# Patient Record
Sex: Female | Born: 1945 | Race: Black or African American | Hispanic: No | Smoking: Never smoker
Health system: Southern US, Community
[De-identification: ages and names within clinical notes are randomized; demographics above are authoritative.]

## PROBLEM LIST (undated history)

## (undated) DIAGNOSIS — J45909 Unspecified asthma, uncomplicated: Secondary | ICD-10-CM

## (undated) DIAGNOSIS — T4145XA Adverse effect of unspecified anesthetic, initial encounter: Secondary | ICD-10-CM

## (undated) DIAGNOSIS — S42253A Displaced fracture of greater tuberosity of unspecified humerus, initial encounter for closed fracture: Secondary | ICD-10-CM

## (undated) DIAGNOSIS — L732 Hidradenitis suppurativa: Secondary | ICD-10-CM

## (undated) DIAGNOSIS — M5430 Sciatica, unspecified side: Secondary | ICD-10-CM

## (undated) DIAGNOSIS — D649 Anemia, unspecified: Secondary | ICD-10-CM

## (undated) DIAGNOSIS — S62319A Displaced fracture of base of unspecified metacarpal bone, initial encounter for closed fracture: Secondary | ICD-10-CM

## (undated) DIAGNOSIS — L02419 Cutaneous abscess of limb, unspecified: Secondary | ICD-10-CM

## (undated) DIAGNOSIS — S4290XA Fracture of unspecified shoulder girdle, part unspecified, initial encounter for closed fracture: Secondary | ICD-10-CM

## (undated) DIAGNOSIS — L03119 Cellulitis of unspecified part of limb: Secondary | ICD-10-CM

## (undated) DIAGNOSIS — G5600 Carpal tunnel syndrome, unspecified upper limb: Secondary | ICD-10-CM

## (undated) DIAGNOSIS — I219 Acute myocardial infarction, unspecified: Secondary | ICD-10-CM

## (undated) HISTORY — PX: TUBAL LIGATION: SHX77

## (undated) HISTORY — DX: Fracture of unspecified shoulder girdle, part unspecified, initial encounter for closed fracture: S42.90XA

## (undated) HISTORY — DX: Hidradenitis suppurativa: L73.2

## (undated) HISTORY — DX: Cutaneous abscess of limb, unspecified: L02.419

## (undated) HISTORY — PX: OTHER SURGICAL HISTORY: SHX169

## (undated) HISTORY — PX: NASAL SINUS SURGERY: SHX719

## (undated) HISTORY — DX: Displaced fracture of base of unspecified metacarpal bone, initial encounter for closed fracture: S62.319A

## (undated) HISTORY — PX: APPENDECTOMY: SHX54

## (undated) HISTORY — DX: Sciatica, unspecified side: M54.30

## (undated) HISTORY — DX: Displaced fracture of greater tuberosity of unspecified humerus, initial encounter for closed fracture: S42.253A

## (undated) HISTORY — DX: Anemia, unspecified: D64.9

## (undated) HISTORY — PX: KNEE SURGERY: SHX244

## (undated) HISTORY — DX: Cellulitis of unspecified part of limb: L03.119

---

## 1983-06-20 DIAGNOSIS — T8859XA Other complications of anesthesia, initial encounter: Secondary | ICD-10-CM

## 1983-06-20 HISTORY — DX: Other complications of anesthesia, initial encounter: T88.59XA

## 1999-02-10 ENCOUNTER — Emergency Department (HOSPITAL_COMMUNITY): Admission: EM | Admit: 1999-02-10 | Discharge: 1999-02-10 | Payer: Self-pay | Admitting: Emergency Medicine

## 2001-03-16 ENCOUNTER — Emergency Department (HOSPITAL_COMMUNITY): Admission: EM | Admit: 2001-03-16 | Discharge: 2001-03-16 | Payer: Self-pay | Admitting: Emergency Medicine

## 2001-03-16 ENCOUNTER — Encounter: Payer: Self-pay | Admitting: Emergency Medicine

## 2001-08-15 ENCOUNTER — Ambulatory Visit (HOSPITAL_COMMUNITY): Admission: RE | Admit: 2001-08-15 | Discharge: 2001-08-15 | Payer: Self-pay | Admitting: Orthopaedic Surgery

## 2001-08-15 ENCOUNTER — Encounter: Payer: Self-pay | Admitting: Orthopaedic Surgery

## 2002-08-16 ENCOUNTER — Emergency Department (HOSPITAL_COMMUNITY): Admission: EM | Admit: 2002-08-16 | Discharge: 2002-08-16 | Payer: Self-pay | Admitting: *Deleted

## 2002-10-03 ENCOUNTER — Emergency Department (HOSPITAL_COMMUNITY): Admission: EM | Admit: 2002-10-03 | Discharge: 2002-10-03 | Payer: Self-pay | Admitting: Emergency Medicine

## 2002-10-07 ENCOUNTER — Ambulatory Visit (HOSPITAL_COMMUNITY): Admission: RE | Admit: 2002-10-07 | Discharge: 2002-10-07 | Payer: Self-pay | Admitting: Emergency Medicine

## 2002-10-07 ENCOUNTER — Encounter: Payer: Self-pay | Admitting: Emergency Medicine

## 2003-05-18 ENCOUNTER — Emergency Department (HOSPITAL_COMMUNITY): Admission: EM | Admit: 2003-05-18 | Discharge: 2003-05-18 | Payer: Self-pay | Admitting: Emergency Medicine

## 2003-11-27 ENCOUNTER — Ambulatory Visit (HOSPITAL_COMMUNITY): Admission: RE | Admit: 2003-11-27 | Discharge: 2003-11-27 | Payer: Self-pay | Admitting: Family Medicine

## 2004-07-28 ENCOUNTER — Ambulatory Visit: Payer: Self-pay | Admitting: Internal Medicine

## 2004-10-27 ENCOUNTER — Emergency Department (HOSPITAL_COMMUNITY): Admission: EM | Admit: 2004-10-27 | Discharge: 2004-10-27 | Payer: Self-pay | Admitting: Emergency Medicine

## 2005-02-15 ENCOUNTER — Ambulatory Visit (HOSPITAL_COMMUNITY): Admission: RE | Admit: 2005-02-15 | Discharge: 2005-02-15 | Payer: Self-pay | Admitting: Internal Medicine

## 2005-07-15 ENCOUNTER — Emergency Department (HOSPITAL_COMMUNITY): Admission: EM | Admit: 2005-07-15 | Discharge: 2005-07-15 | Payer: Self-pay | Admitting: Emergency Medicine

## 2005-12-29 ENCOUNTER — Emergency Department (HOSPITAL_COMMUNITY): Admission: EM | Admit: 2005-12-29 | Discharge: 2005-12-29 | Payer: Self-pay | Admitting: Emergency Medicine

## 2006-10-15 ENCOUNTER — Encounter (INDEPENDENT_AMBULATORY_CARE_PROVIDER_SITE_OTHER): Payer: Self-pay | Admitting: Specialist

## 2006-10-15 ENCOUNTER — Ambulatory Visit (HOSPITAL_COMMUNITY): Admission: RE | Admit: 2006-10-15 | Discharge: 2006-10-15 | Payer: Self-pay | Admitting: Internal Medicine

## 2006-10-15 ENCOUNTER — Ambulatory Visit: Payer: Self-pay | Admitting: Internal Medicine

## 2006-10-15 HISTORY — PX: COLONOSCOPY: SHX174

## 2006-12-31 ENCOUNTER — Emergency Department (HOSPITAL_COMMUNITY): Admission: EM | Admit: 2006-12-31 | Discharge: 2006-12-31 | Payer: Self-pay | Admitting: Emergency Medicine

## 2007-01-07 ENCOUNTER — Ambulatory Visit (HOSPITAL_COMMUNITY): Admission: RE | Admit: 2007-01-07 | Discharge: 2007-01-07 | Payer: Self-pay | Admitting: Emergency Medicine

## 2007-01-07 ENCOUNTER — Emergency Department (HOSPITAL_COMMUNITY): Admission: EM | Admit: 2007-01-07 | Discharge: 2007-01-07 | Payer: Self-pay | Admitting: Emergency Medicine

## 2007-02-26 ENCOUNTER — Emergency Department (HOSPITAL_COMMUNITY): Admission: EM | Admit: 2007-02-26 | Discharge: 2007-02-26 | Payer: Self-pay | Admitting: Emergency Medicine

## 2007-05-03 ENCOUNTER — Emergency Department (HOSPITAL_COMMUNITY): Admission: EM | Admit: 2007-05-03 | Discharge: 2007-05-03 | Payer: Self-pay | Admitting: Emergency Medicine

## 2007-05-17 ENCOUNTER — Ambulatory Visit (HOSPITAL_COMMUNITY): Admission: RE | Admit: 2007-05-17 | Discharge: 2007-05-17 | Payer: Self-pay | Admitting: Family Medicine

## 2007-10-10 ENCOUNTER — Encounter (INDEPENDENT_AMBULATORY_CARE_PROVIDER_SITE_OTHER): Payer: Self-pay | Admitting: Family Medicine

## 2007-10-25 ENCOUNTER — Telehealth (INDEPENDENT_AMBULATORY_CARE_PROVIDER_SITE_OTHER): Payer: Self-pay | Admitting: *Deleted

## 2007-10-25 ENCOUNTER — Ambulatory Visit: Payer: Self-pay | Admitting: Internal Medicine

## 2007-10-26 ENCOUNTER — Encounter (INDEPENDENT_AMBULATORY_CARE_PROVIDER_SITE_OTHER): Payer: Self-pay | Admitting: Internal Medicine

## 2007-10-28 ENCOUNTER — Telehealth (INDEPENDENT_AMBULATORY_CARE_PROVIDER_SITE_OTHER): Payer: Self-pay | Admitting: *Deleted

## 2007-10-28 LAB — CONVERTED CEMR LAB
BUN: 13 mg/dL (ref 6–23)
Basophils Absolute: 0 10*3/uL (ref 0.0–0.1)
Basophils Relative: 0 % (ref 0–1)
Hemoglobin: 12.3 g/dL (ref 12.0–15.0)
Lymphocytes Relative: 23 % (ref 12–46)
MCHC: 31.9 g/dL (ref 30.0–36.0)
MCV: 75.2 fL — ABNORMAL LOW (ref 78.0–100.0)
Monocytes Absolute: 0.5 10*3/uL (ref 0.1–1.0)
Monocytes Relative: 6 % (ref 3–12)
Neutro Abs: 5.8 10*3/uL (ref 1.7–7.7)
Neutrophils Relative %: 69 % (ref 43–77)
Potassium: 4.3 meq/L (ref 3.5–5.3)
RBC: 5.12 M/uL — ABNORMAL HIGH (ref 3.87–5.11)
RDW: 15.8 % — ABNORMAL HIGH (ref 11.5–15.5)
Sodium: 144 meq/L (ref 135–145)
TSH: 2.164 microintl units/mL (ref 0.350–5.50)
Triglycerides: 102 mg/dL (ref <150)

## 2007-11-12 ENCOUNTER — Telehealth (INDEPENDENT_AMBULATORY_CARE_PROVIDER_SITE_OTHER): Payer: Self-pay | Admitting: *Deleted

## 2007-12-04 ENCOUNTER — Ambulatory Visit: Payer: Self-pay | Admitting: Internal Medicine

## 2007-12-04 ENCOUNTER — Telehealth (INDEPENDENT_AMBULATORY_CARE_PROVIDER_SITE_OTHER): Payer: Self-pay | Admitting: Internal Medicine

## 2007-12-04 DIAGNOSIS — L732 Hidradenitis suppurativa: Secondary | ICD-10-CM

## 2007-12-04 HISTORY — DX: Hidradenitis suppurativa: L73.2

## 2007-12-18 HISTORY — PX: LIPOMA EXCISION: SHX5283

## 2007-12-25 ENCOUNTER — Ambulatory Visit (HOSPITAL_COMMUNITY): Admission: RE | Admit: 2007-12-25 | Discharge: 2007-12-25 | Payer: Self-pay | Admitting: General Surgery

## 2007-12-25 ENCOUNTER — Encounter (INDEPENDENT_AMBULATORY_CARE_PROVIDER_SITE_OTHER): Payer: Self-pay | Admitting: General Surgery

## 2008-01-10 ENCOUNTER — Emergency Department (HOSPITAL_COMMUNITY): Admission: EM | Admit: 2008-01-10 | Discharge: 2008-01-10 | Payer: Self-pay | Admitting: Emergency Medicine

## 2008-02-04 ENCOUNTER — Telehealth (INDEPENDENT_AMBULATORY_CARE_PROVIDER_SITE_OTHER): Payer: Self-pay | Admitting: *Deleted

## 2008-02-05 ENCOUNTER — Ambulatory Visit: Payer: Self-pay | Admitting: Internal Medicine

## 2008-02-12 ENCOUNTER — Encounter (INDEPENDENT_AMBULATORY_CARE_PROVIDER_SITE_OTHER): Payer: Self-pay | Admitting: Internal Medicine

## 2008-07-01 ENCOUNTER — Emergency Department (HOSPITAL_COMMUNITY): Admission: EM | Admit: 2008-07-01 | Discharge: 2008-07-01 | Payer: Self-pay | Admitting: Emergency Medicine

## 2008-11-27 ENCOUNTER — Ambulatory Visit: Payer: Self-pay | Admitting: Internal Medicine

## 2008-11-27 DIAGNOSIS — L02419 Cutaneous abscess of limb, unspecified: Secondary | ICD-10-CM

## 2008-11-27 DIAGNOSIS — L03119 Cellulitis of unspecified part of limb: Secondary | ICD-10-CM

## 2008-11-27 HISTORY — DX: Cellulitis of unspecified part of limb: L02.419

## 2008-11-27 HISTORY — DX: Cellulitis of unspecified part of limb: L03.119

## 2009-07-31 ENCOUNTER — Emergency Department (HOSPITAL_COMMUNITY): Admission: EM | Admit: 2009-07-31 | Discharge: 2009-08-01 | Payer: Self-pay | Admitting: Emergency Medicine

## 2009-08-17 DIAGNOSIS — I219 Acute myocardial infarction, unspecified: Secondary | ICD-10-CM

## 2009-08-17 HISTORY — DX: Acute myocardial infarction, unspecified: I21.9

## 2009-08-27 ENCOUNTER — Encounter: Payer: Self-pay | Admitting: Orthopedic Surgery

## 2009-08-27 ENCOUNTER — Emergency Department (HOSPITAL_COMMUNITY): Admission: EM | Admit: 2009-08-27 | Discharge: 2009-08-27 | Payer: Self-pay | Admitting: Emergency Medicine

## 2009-09-21 ENCOUNTER — Encounter: Payer: Self-pay | Admitting: Orthopedic Surgery

## 2009-09-21 ENCOUNTER — Ambulatory Visit (HOSPITAL_COMMUNITY)
Admission: RE | Admit: 2009-09-21 | Discharge: 2009-09-21 | Payer: Self-pay | Source: Home / Self Care | Admitting: Chiropractic Medicine

## 2009-09-29 ENCOUNTER — Ambulatory Visit (HOSPITAL_COMMUNITY): Admission: RE | Admit: 2009-09-29 | Discharge: 2009-09-29 | Payer: Self-pay | Admitting: Family Medicine

## 2009-11-16 ENCOUNTER — Ambulatory Visit: Payer: Self-pay | Admitting: Orthopedic Surgery

## 2009-11-16 DIAGNOSIS — S62319A Displaced fracture of base of unspecified metacarpal bone, initial encounter for closed fracture: Secondary | ICD-10-CM

## 2009-11-16 HISTORY — DX: Displaced fracture of base of unspecified metacarpal bone, initial encounter for closed fracture: S62.319A

## 2010-01-05 ENCOUNTER — Encounter: Payer: Self-pay | Admitting: Orthopedic Surgery

## 2010-01-07 ENCOUNTER — Telehealth: Payer: Self-pay | Admitting: Orthopedic Surgery

## 2010-01-10 ENCOUNTER — Ambulatory Visit: Payer: Self-pay | Admitting: Orthopedic Surgery

## 2010-01-20 ENCOUNTER — Emergency Department (HOSPITAL_COMMUNITY): Admission: EM | Admit: 2010-01-20 | Discharge: 2010-01-20 | Payer: Self-pay | Admitting: Emergency Medicine

## 2010-02-09 ENCOUNTER — Ambulatory Visit: Payer: Self-pay | Admitting: Orthopedic Surgery

## 2010-02-23 ENCOUNTER — Encounter (HOSPITAL_COMMUNITY)
Admission: RE | Admit: 2010-02-23 | Discharge: 2010-03-25 | Payer: Self-pay | Source: Home / Self Care | Admitting: Orthopedic Surgery

## 2010-03-08 ENCOUNTER — Encounter: Payer: Self-pay | Admitting: Orthopedic Surgery

## 2010-03-16 ENCOUNTER — Telehealth: Payer: Self-pay | Admitting: Orthopedic Surgery

## 2010-03-17 ENCOUNTER — Encounter: Payer: Self-pay | Admitting: Orthopedic Surgery

## 2010-03-31 ENCOUNTER — Encounter: Payer: Self-pay | Admitting: Orthopedic Surgery

## 2010-04-11 ENCOUNTER — Telehealth (INDEPENDENT_AMBULATORY_CARE_PROVIDER_SITE_OTHER): Payer: Self-pay | Admitting: *Deleted

## 2010-04-12 ENCOUNTER — Telehealth: Payer: Self-pay | Admitting: Orthopedic Surgery

## 2010-04-19 HISTORY — PX: SEPTOPLASTY: SUR1290

## 2010-05-10 ENCOUNTER — Ambulatory Visit: Payer: Self-pay | Admitting: Orthopedic Surgery

## 2010-05-24 ENCOUNTER — Telehealth: Payer: Self-pay | Admitting: Orthopedic Surgery

## 2010-05-27 ENCOUNTER — Ambulatory Visit (HOSPITAL_COMMUNITY)
Admission: RE | Admit: 2010-05-27 | Discharge: 2010-05-27 | Payer: Self-pay | Source: Home / Self Care | Attending: Family Medicine | Admitting: Family Medicine

## 2010-05-31 ENCOUNTER — Emergency Department (HOSPITAL_COMMUNITY)
Admission: EM | Admit: 2010-05-31 | Discharge: 2010-05-31 | Payer: Self-pay | Source: Home / Self Care | Admitting: Emergency Medicine

## 2010-05-31 ENCOUNTER — Encounter (INDEPENDENT_AMBULATORY_CARE_PROVIDER_SITE_OTHER): Payer: Self-pay | Admitting: *Deleted

## 2010-06-02 ENCOUNTER — Encounter (INDEPENDENT_AMBULATORY_CARE_PROVIDER_SITE_OTHER): Payer: Self-pay | Admitting: *Deleted

## 2010-06-02 ENCOUNTER — Inpatient Hospital Stay (HOSPITAL_COMMUNITY)
Admission: EM | Admit: 2010-06-02 | Discharge: 2010-06-09 | Payer: Self-pay | Source: Home / Self Care | Attending: Internal Medicine | Admitting: Internal Medicine

## 2010-06-03 ENCOUNTER — Inpatient Hospital Stay (HOSPITAL_COMMUNITY)
Admission: AD | Admit: 2010-06-03 | Discharge: 2010-06-09 | Payer: Self-pay | Attending: Internal Medicine | Admitting: Internal Medicine

## 2010-06-07 ENCOUNTER — Encounter (INDEPENDENT_AMBULATORY_CARE_PROVIDER_SITE_OTHER): Payer: Self-pay | Admitting: Internal Medicine

## 2010-06-17 ENCOUNTER — Emergency Department (HOSPITAL_COMMUNITY)
Admission: EM | Admit: 2010-06-17 | Discharge: 2010-06-17 | Payer: Self-pay | Source: Home / Self Care | Admitting: Emergency Medicine

## 2010-06-30 ENCOUNTER — Telehealth: Payer: Self-pay | Admitting: Orthopedic Surgery

## 2010-06-30 ENCOUNTER — Emergency Department (HOSPITAL_COMMUNITY)
Admission: EM | Admit: 2010-06-30 | Discharge: 2010-06-30 | Payer: Self-pay | Source: Home / Self Care | Admitting: Emergency Medicine

## 2010-07-04 LAB — COMPREHENSIVE METABOLIC PANEL
ALT: 18 U/L (ref 0–35)
AST: 17 U/L (ref 0–37)
Albumin: 3.2 g/dL — ABNORMAL LOW (ref 3.5–5.2)
Alkaline Phosphatase: 97 U/L (ref 39–117)
BUN: 6 mg/dL (ref 6–23)
CO2: 26 mEq/L (ref 19–32)
Calcium: 8.8 mg/dL (ref 8.4–10.5)
Chloride: 105 mEq/L (ref 96–112)
Creatinine, Ser: 0.83 mg/dL (ref 0.4–1.2)
GFR calc Af Amer: >60 mL/min (ref >60)
GFR calc non Af Amer: >60 mL/min (ref >60)
Glucose, Bld: 91 mg/dL (ref 70–99)
Potassium: 3.2 mEq/L — ABNORMAL LOW (ref 3.5–5.1)
Sodium: 141 mEq/L (ref 135–145)
Total Bilirubin: 0.6 mg/dL (ref 0.3–1.2)
Total Protein: 7.2 g/dL (ref 6.0–8.3)

## 2010-07-04 LAB — DIFFERENTIAL
Basophils Absolute: 0.1 10*3/uL (ref 0.0–0.1)
Basophils Relative: 1 % (ref 0–1)
Eosinophils Absolute: 1.3 10*3/uL — ABNORMAL HIGH (ref 0.0–0.7)
Eosinophils Relative: 11 % — ABNORMAL HIGH (ref 0–5)
Lymphocytes Relative: 11 % — ABNORMAL LOW (ref 12–46)
Lymphs Abs: 1.3 10*3/uL (ref 0.7–4.0)
Monocytes Absolute: 0.9 10*3/uL (ref 0.1–1.0)
Monocytes Relative: 8 % (ref 3–12)
Neutro Abs: 8 10*3/uL — ABNORMAL HIGH (ref 1.7–7.7)
Neutrophils Relative %: 69 % (ref 43–77)

## 2010-07-04 LAB — CBC
HCT: 33.6 % — ABNORMAL LOW (ref 36.0–46.0)
Hemoglobin: 11.2 g/dL — ABNORMAL LOW (ref 12.0–15.0)
MCH: 24.8 pg — ABNORMAL LOW (ref 26.0–34.0)
MCHC: 33.3 g/dL (ref 30.0–36.0)
MCV: 74.5 fL — ABNORMAL LOW (ref 78.0–100.0)
Platelets: 182 10*3/uL (ref 150–400)
RBC: 4.51 MIL/uL (ref 3.87–5.11)
RDW: 15.6 % — ABNORMAL HIGH (ref 11.5–15.5)
WBC: 11.6 10*3/uL — ABNORMAL HIGH (ref 4.0–10.5)

## 2010-07-09 ENCOUNTER — Encounter: Payer: Self-pay | Admitting: Family Medicine

## 2010-07-10 ENCOUNTER — Encounter: Payer: Self-pay | Admitting: Internal Medicine

## 2010-07-10 ENCOUNTER — Encounter: Payer: Self-pay | Admitting: Family Medicine

## 2010-07-11 ENCOUNTER — Encounter: Payer: Self-pay | Admitting: Internal Medicine

## 2010-07-14 NOTE — Op Note (Addendum)
Stacy Ashley, Stacy Ashley              ACCOUNT NO.:  [id number]  MEDICAL RECORD NO.:  [id number]          PATIENT TYPE:  INP  LOCATION:  5509                         FACILITY:  [facility]  PHYSICIAN:  Elvie Maines H. 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  POSTOPERATIVE DIAGNOSES:  Chronic pansinusitis with purulent secretions contained within both maxillary sinuses and the left sphenoid sinus. The left lateral sphenoid sinus contained a large polyp behind which was some inspissated purulent secretions.  There was no evidence of any other tumor or neoplasm.  PROCEDURE: 1. Endoscopic left sphenoidotomy with removal of mass. 2. Endoscopic left frontal sinusotomy. 3. Stealth image-guided system use.  SURGEON:  Davaun Quintela H. Pollyann Kennedy, MD.  ANESTHESIA:  General endotracheal anesthesia was used.  COMPLICATIONS:  No complications.  ESTIMATED BLOOD LOSS:  Less than 100 mL.  SPECIMEN: 1. Allergic fungal type mucinous material from the left maxillary     sinus sent for bacterial and fungal culture. 2. Left lateral sphenoid sinus mass. 3. Left sinus contents.  HISTORY:  This is a 65 year old lady who had sinus surgery at [facility] several years back.  She was admitted to the hospital with facial and pharyngeal swelling, severe nasal congestion, chronic sinusitis, and left trigeminal second and third division hypesthesia.  Risks, benefits, alternatives, complications of the procedure were explained to the patient and her daughter.  They seemed to understand and agreed to surgery.  PROCEDURE:  The patient was taken to the operating room, placed on the operative table in supine position.  Following induction of general endotracheal  anesthesia, the face was prepped and draped in standard fashion.  Ophthalmic lube was used and the eyes and the eyes were kept untaped.  The head gear was placed and registered with the image-guided system.  During the procedure, the straight and curved sections as well as a sinus seeker which was registered to the system were all used for navigation purposes.  This was particularly useful in identifying the mass in the lateral sphenoid to make sure that this did not involve contents of the orbit, optic nerve, or cavernous sinus as well as the carotid artery.  It was also helpful in identifying the left frontal sinus. 1. Left endoscopic sphenoidotomy with removal of tissue.  1% Xylocaine     with epinephrine was infiltrated on the superior and posterior     attachments into the middle turbinates bilaterally.  A spinal     needle was then used to inject this into the face of the sphenoid     inferior to the sphenoidotomy.  The 0-degree endoscope was used to     inspect the sphenoethmoidal recess.  The sphenoid sinus was opened     superiorly but occluded with polypoid tissue which was debrided     away using the microdebrider.  Once the sinus was entered, it was  seen that it was filled with purulent secretions which were     suctioned out.  The 30-degree scope was then used to inspect more     laterally where the polypoid mass was identified.  The navigation     system was used to confirm that this was in fact what appeared to     be the expansile mass on imaging.  It was difficult to find an     instrument to grab this at such an angle but I was able to get part     of it dislodged with a giraffe forceps and then I was able to pull     it out completely with Genevie Ann Blakesley forceps.  Lateral to this     mass, once the pus was suctioned out, the sinus looked clear and     intact without any bony dehiscence or any ulceration and no     evidence of any additional neoplasm. 2. Left  endoscopic frontal sinusotomy.  The middle turbinate was     completely intact including the ground lamella but it had a     lateralized position and with some adhesion to the lamina papyracea     making it very difficult to inspect the orbit.  The large Tru-Cut     forceps were then used to remove the anterior and inferior aspect     of middle turbinate thus facilitating exposure.  I was unable to     enter into the frontal ethmoidal recess with a curved suction and     into the frontal sinus.  Frontal sinus was very narrow but I was     able to get suction in and there was some thick mucoid secretions     and then I was able to suction out.  The bone around the frontal     duct was very thick and I did not attempt to enlarge the ostium. 3. The maxillary sinus on the left was inspected with a angled suction     and 30-degree scope and large amount of polypoid material was     cleaned out as well as the allergic fungal type mucinous material     which was sent for culture.  The left ethmoid cavity was packed     with Nasopore.  The right side was inspected and pus was suctioned     from maxillary sinus but the extensive polypoid disease was not     present on the right side, so no further action was taken.  The     pharynx was suctioned of blood and secretions.  The patient was     wakened, extubated, and transferred to recovery in stable     condition.     Chivonne Rascon H. Pollyann Kennedy, MD     JHR/MEDQ  D:  06/07/2010  T:  06/07/2010  Job:  [id number]  Electronically Signed by Serena Colonel MD on 07/14/2010 10:46:12 AM

## 2010-07-14 NOTE — Consult Note (Addendum)
Stacy Ashley, HUESMAN              ACCOUNT NO.:  [id number]  MEDICAL RECORD NO.:  [id number]          PATIENT TYPE:  INP  LOCATION:  5509                         FACILITY:  [facility]  PHYSICIAN:  Lorriane Dehart H. Pollyann Kennedy, MD     DATE OF BIRTH:  1945/09/30  DATE OF CONSULTATION:  06/03/2010 DATE OF DISCHARGE:                                CONSULTATION   REASON FOR CONSULTATION:  Breathing and swallowing difficulty, cervical adenopathy and sinusitis.  HISTORY:  This is a 65 year old lady previously in good health without any past medical history.  In August, she developed severe left-sided sinus congestion, pain and pressure and swelling of the entire left hemiface as well as numbness and tingling of the left side of the face. She was treated by her primary care physician with 5 weeks of an antibiotic and she describes that at the end of the 5 weeks thing started to go little bit better.  The symptoms then returned and she admitted to the hospital in [facility] for similar symptoms.  She was transferred here for continued ENT workup.  She has a history of sinus surgery some point in the past.  She takes no medications.  No known drug allergies.  PHYSICAL EXAMINATION:  She is a generally healthy-appearing lady in no distress.  She seems slightly short of breath, but is in no distress. She has no stridor and her voice is clear.  Oral cavity and pharynx are unremarkable.  There is multiple missing teeth, but no signs of infection.  Nasal exam reveals diffuse mucosal edema especially on the right side.  Palpation of the neck reveals multiple jugular and anterior triangle nodes, none larger than about 2 cm.  There was no palpable thyroid masses.  Flexible fiberoptic laryngoscopy was performed after topical Xylocaine viscous was applied.  The right nasal cavity was completely swollen, I was unable to pass the scope, but I was able to pass this relatively easily on the left.  There was inflammatory  changes of the nasopharyngeal mucosa without evidence of abscess, but there was some exudate on the surface.  The oropharynx, hypopharynx and larynx were completely normal to inspection.  The vocal cords were move well.  There was no masses and no evidence of airway obstruction.  CT is reviewed.  There is extensive postsurgical changes involving the ethmoid sinuses bilaterally and maxillary antrostomies bilaterally. There was diffuse mucosal soft tissue edema on the scan consistent with polyps.  There was opacity of the right frontal and thickening of the left frontal.  There was left sphenoid disease with a possible air-fluid level and mild thickening on the right.  The middle turbinate on the left side appear surgically absent.  There was no obvious orbital mass or infratemporal fossa mass seen and the CT of the neck and chest reveals multiple large lymph nodes bilaterally in the neck and mediastinum.  None larger than about 2 cm.  IMPRESSION:  Chronic sinusitis with evidence of pulmonary disease.  No evidence of airway obstruction.  No evidence of laryngeal or hypopharyngeal pathology.  The most worrisome two symptoms are the edema she had at the  left side of the face and the hypesthesia she had on the left side which she currently displaced as well on examination.  The second and third divisions of the trigeminies are diminished to soft touch on the left side when compared to the right side and when compared to the first division.  This is worrisome for neoplastic process.  I would recommend an MRI of the head neck to rule that out perhaps something in the intracranial fossa or in the infratemporal fossa.  She seems stable enough to not remain in the intensive care unit setting and I will discuss that with her primary care team.  We will follow up this weekend with results of the MRI and whenever other findings we should uncover.     Caterra Ostroff H. Pollyann Kennedy, MD     JHR/MEDQ  D:   06/03/2010  T:  06/04/2010  Job:  [id number]  Electronically Signed by Serena Colonel MD on 07/14/2010 10:46:09 AM

## 2010-07-15 ENCOUNTER — Encounter: Payer: Self-pay | Admitting: Orthopedic Surgery
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
## 2010-07-19 NOTE — Progress Notes (Signed)
Summary: asking for referral to Dr Ladona Ridgel, chiropractor  Phone Note Call from Patient   Caller: Patient Summary of Call: Patient states physical therapy not helping LT hand, states still having pain with bending & moving hand, and seems to be also in LT wrist all the time.  Asking if we can do a referral to Dr Reatha Harps, chiropractor.  States she has been to him before/ was advised referral needed from our office for problem LT hand pain.  Patient ph# [phone]. Initial call taken by: Cammie Sickle,  April 11, 2010 3:10 PM  Follow-up for Phone Call        okay Follow-up by: Fuller Canada MD,  April 11, 2010 3:40 PM  Additional Follow-up for Phone Call Additional follow up Details #1::        made referral Additional Follow-up by: Chasity Tereasa Coop,  April 11, 2010 4:51 PM

## 2010-07-19 NOTE — Miscellaneous (Signed)
Summary: PT progress notes  PT progress notes   Imported By: Jacklynn Ganong 05/11/2010 07:52:25  _____________________________________________________________________  External Attachment:    Type:   Image     Comment:   External Document

## 2010-07-19 NOTE — Miscellaneous (Signed)
Summary: OT clinical evaluation  OT clinical evaluation   Imported By: Jacklynn Ganong 03/08/2010 12:19:50  _____________________________________________________________________  External Attachment:    Type:   Image     Comment:   External Document

## 2010-07-19 NOTE — Miscellaneous (Signed)
Summary: PT evaluation  PT evaluation   Imported By: Jacklynn Ganong 03/31/2010 10:06:25  _____________________________________________________________________  External Attachment:    Type:   Image     Comment:   External Document

## 2010-07-19 NOTE — Miscellaneous (Signed)
Summary: PT re-fax notes to [facility]  PT re-fax notes to [facility]   Imported By: Cammie Sickle 03/24/2010 14:47:40  _____________________________________________________________________  External Attachment:    Type:   Image     Comment:   External Document

## 2010-07-19 NOTE — Progress Notes (Signed)
Summary: call from patient about cast  Phone Note Call from Patient   Caller: Patient Summary of Call: Patient called in to relay that her cast is wet from sweating; states has been like this for a few days; given first available appointment, Monday 8:30am 7/25, as advised Dr is in surgery today.   Initial call taken by: Cammie Sickle,  January 07, 2010 10:25 AM  Follow-up for Phone Call        good job sweating does nt count for wetness Follow-up by: Fuller Canada MD,  January 07, 2010 11:32 AM

## 2010-07-19 NOTE — Progress Notes (Signed)
Summary: Referral to Dr. Ladona Ridgel  Phone Note Outgoing Call   Call placed by: Waldon Reining,  April 12, 2010 11:10 AM Call placed to: Specialist Action Taken: Information Sent Summary of Call: I faxed a referral to Dr. Ladona Ridgel.

## 2010-07-19 NOTE — Progress Notes (Signed)
Summary: Patient going to [facility] for therapy,order updated  ---- Converted from flag ---- ---- 03/16/2010 2:37 PM, Jacklynn Ganong wrote: Stacy Ashley left you a message, she is going to therapy today at 5:30 Does not know the name of the place but it is at  [address] [place], [place].                 Phone # [phone]    Fax # [phone] ------------------------------  Phone Note Call from Patient   Summary of Call: Patient called to relay that she didn't realize she had an appt yesterday, and states she has only been to therapy once, due to work schedule. Since she works in [place], would like to change to [facility] location.  Order updated and faxed to [phone] Initial call taken by: Cammie Sickle,  March 16, 2010 2:52 PM
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
## 2010-07-19 NOTE — Progress Notes (Signed)
Summary: Progress note  Progress note   Imported By: Jacklynn Ganong 01/11/2010 13:57:57  _____________________________________________________________________  External Attachment:    Type:   Image     Comment:   External Document

## 2010-07-19 NOTE — Letter (Signed)
Summary: History form  History form   Imported By: Jacklynn Ganong 11/25/2009 16:58:05  _____________________________________________________________________  External Attachment:    Type:   Image     Comment:   External Document

## 2010-07-19 NOTE — Miscellaneous (Signed)
Summary: [facility] note  [facility] note   Imported By: Cammie Sickle 04/09/2010 19:29:30  _____________________________________________________________________  External Attachment:    Type:   Image     Comment:   External Document
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
## 2010-07-19 NOTE — Letter (Signed)
Summary: Medical record request [employer]  Medical record request [facility]   Imported By: Cammie Sickle 01/12/2010 09:19:54  _____________________________________________________________________  External Attachment:    Type:   Image     Comment:   External Document

## 2010-07-20 NOTE — Discharge Summary (Signed)
  [name], Stacy Ashley              ACCOUNT NO.:  [id number]  MEDICAL RECORD NO.:  [id number]          PATIENT TYPE:  INP  LOCATION:  5509                         FACILITY:  [facility]  PHYSICIAN:  Pincus Large, MD     DATE OF BIRTH:  08/05/1945  DATE OF ADMISSION:  06/03/2010 DATE OF DISCHARGE:  06/09/2010                              DISCHARGE SUMMARY   REASON FOR ADMISSION:  Breathing and swallowing difficulty, cervical lymphadenopathy, and sinusitis.  DISCHARGE DIAGNOSES: 1. Severe sinusitis, status post debridement. 2. Hypertension. 3. Asthma.  HOSPITAL COURSE:  She is a 65 year old female with history of childhood asthma and history of sinusitis who has nasal surgery in past, who was initially seen 2 days ago in the ED with shortness of breath.  She had a CT of the sinus which showed severe sinusitis.  She was discharged home on amoxicillin.  She was unable to breathe through her nose.  She was unable to breathe through her mouth.  She had no cough, no fever.  The patient was admitted.  Her lab when she came in, her sodium was 133, potassium was 3.7, chloride was 95, bicarb was 28, her glucose was 119, BUN was 13, creatinine was 0.96, albumin was 3.3.  LFTs within normal limits.  Her ANC was 15.2, hemoglobin was 12.4.  CT of the neck with contrast today was done, which shows complete occlusion of the nasopharynx with large amount of edema and subcutaneous inflation. There is a swelling of the uvula and adenoid.  There is edema extending inferiorly to the level of the palatine tonsil.  There is, however, no evidence of abscess.  There is also associated near complete opacification of the maxillary sinuses.  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  The patient was admitted.  She received IV Zosyn and ENT was consulted and the patient was taken to the OR for debridement of her sinuses.  Pathology did not show any mass or any growth yet.  The patient was seen by  ID consult which recommended to continue Zosyn and add antifungal medication.  The patient has some tingling around her mouth which did improve after the treatment of the sinuses.  The patient has an MRI of the brain which showed no focal parenchymal or dural enhancement to possess any intracranial inflammatory changes.  There is extensive sinus disease prior to sinus surgery.  The patient had a CT of the head which shows moderate mucosal thickening and sphenoid sinus air itself and upper aspect of the pterygoid plate.  The patient's EKG was normal sinus, no significant ST- T changes, 65 beats per minute.  DISCHARGE MEDICATIONS: 1. Amoxicillin 500 q.8. 2. Diflucan 100 p.o. x10 days. 3. Claritin 10 mg nightly.  DISPOSITION:  Home.          ______________________________ Pincus Large, MD     SA/MEDQ  D:  06/09/2010  T:  06/10/2010  Job:  [id number]  Electronically Signed by Pincus Large  on 06/14/2010 03:14:49 PM

## 2010-07-21 NOTE — Miscellaneous (Signed)
Summary: hand specialist referral  Clinical Lists Changes  Medications: Rx of NORCO 5-325 MG TABS (HYDROCODONE-ACETAMINOPHEN) 1 q 4 as needed pain;  #84 x 3;  Signed;  Entered by: Ether Griffins;  Authorized by: Fuller Canada MD;  Method used: Handwritten Orders: Added new Referral order of Orthopedic Surgeon Referral (Ortho Surgeon) - Signed    Prescriptions: NORCO 5-325 MG TABS (HYDROCODONE-ACETAMINOPHEN) 1 q 4 as needed pain  #84 x 3   Entered by:   Ether Griffins   Authorized by:   Fuller Canada MD   Signed by:   Ether Griffins on 05/31/2010   Method used:   Handwritten   RxID:   [id number]

## 2010-07-21 NOTE — Miscellaneous (Signed)
Summary: faxed referral to dr. Amanda Pea  Clinical Lists Changes

## 2010-07-21 NOTE — Progress Notes (Signed)
Summary: Dr. Amanda Pea declined an appointment  Phone Note Other Incoming   Summary of Call: Kelly/[facility] called today to say that Dr. Amanda Pea reviewed the referral notes for [facility] and declined to schedule an appointment with him.  Said he did not feel there was any further treatment he could offer her. Kelly's # [phone] Initial call taken by: Jacklynn Ganong,  June 30, 2010 10:05 AM

## 2010-07-21 NOTE — Progress Notes (Signed)
Summary: call to patient to relay information  Phone Note Outgoing Call   Call placed to: Patient Summary of Call: Called patient re: call received from [facility] to relay that Dr. Amanda Pea has denied the referral appointment.  Patient had been contacted directly, said that it was explained that Dr. Amanda Pea did not feel there was anything further that he would be able to do. Patient mentioned that she is working with an attorney's office and may need medical records; advised that signed authorization and request would need to be sent to our office for records needed. Initial call taken by: Cammie Sickle,  June 30, 2010 10:32 AM

## 2010-07-21 NOTE — Progress Notes (Signed)
Summary: Her wrist still hurts  Phone Note Call from Patient   Summary of Call: Stacy Ashley ([birth year]) says she still has sharp burning pains in left wrist when turning hand a certain way. Things like cooking, holding  pots and pans casues the pain,cannot open jars with left hand.  Asking if she needs more treatment  or would seeing a chiropractor help?  Her # [phone] Initial call taken by: Jacklynn Ganong,  May 24, 2010 9:50 AM  Follow-up for Phone Call        I dont think I have any other recommendations   we are happy to refer to Hand specialist for 2nd opinion  Follow-up by: Fuller Canada MD,  May 24, 2010 10:00 AM  Additional Follow-up for Phone Call Additional follow up Details #1::        She said yes,  refer to hand specialist Additional Follow-up by: Jacklynn Ganong,  May 24, 2010 10:04 AM     Appended Document: Her wrist still hurts this message was not sent to me to make referral out, would not have know if i did not receive pain med request, referral is being arranged.

## 2010-07-27 NOTE — Letter (Signed)
Summary: Medical records request [facility]  Medical records request [facility]   Imported By: Cammie Sickle 07/22/2010 14:02:56  _____________________________________________________________________  External Attachment:    Type:   Image     Comment:   External Document

## 2010-07-29 ENCOUNTER — Encounter: Payer: Self-pay | Admitting: Orthopedic Surgery

## 2010-08-16 NOTE — Letter (Signed)
Summary: Medical record request [facility]  Medical record request [facility]   Imported By: Cammie Sickle 08/08/2010 17:53:07  _____________________________________________________________________  External Attachment:    Type:   Image     Comment:   External Document

## 2010-08-29 LAB — BASIC METABOLIC PANEL
BUN: 16 mg/dL (ref 6–23)
BUN: 7 mg/dL (ref 6–23)
CO2: 29 mEq/L (ref 19–32)
Calcium: 9 mg/dL (ref 8.4–10.5)
Chloride: 98 mEq/L (ref 96–112)
Creatinine, Ser: 0.93 mg/dL (ref 0.4–1.2)
GFR calc Af Amer: >60 mL/min (ref >60)
GFR calc non Af Amer: 51 mL/min — ABNORMAL LOW (ref >60)
Glucose, Bld: 101 mg/dL — ABNORMAL HIGH (ref 70–99)
Glucose, Bld: 133 mg/dL — ABNORMAL HIGH (ref 70–99)
Potassium: 3.6 mEq/L (ref 3.5–5.1)

## 2010-08-29 LAB — CBC
HCT: 33.8 % — ABNORMAL LOW (ref 36.0–46.0)
HCT: 37.8 % (ref 36.0–46.0)
HCT: 38.5 % (ref 36.0–46.0)
Hemoglobin: 12.4 g/dL (ref 12.0–15.0)
Hemoglobin: 12.6 g/dL (ref 12.0–15.0)
Hemoglobin: 11.1 g/dL — ABNORMAL LOW (ref 12.0–15.0)
Hemoglobin: 11.8 g/dL — ABNORMAL LOW (ref 12.0–15.0)
Hemoglobin: 12.9 g/dL (ref 12.0–15.0)
MCH: 25.3 pg — ABNORMAL LOW (ref 26.0–34.0)
MCH: 24.5 pg — ABNORMAL LOW (ref 26.0–34.0)
MCH: 24.7 pg — ABNORMAL LOW (ref 26.0–34.0)
MCH: 25.1 pg — ABNORMAL LOW (ref 26.0–34.0)
MCHC: 33.1 g/dL (ref 30.0–36.0)
MCHC: 33.1 g/dL (ref 30.0–36.0)
MCHC: 33.1 g/dL (ref 30.0–36.0)
MCHC: 33.3 g/dL (ref 30.0–36.0)
MCHC: 33.6 g/dL (ref 30.0–36.0)
MCHC: 34.1 g/dL (ref 30.0–36.0)
MCV: 72.3 fL — ABNORMAL LOW (ref 78.0–100.0)
MCV: 74.1 fL — ABNORMAL LOW (ref 78.0–100.0)
MCV: 74 fL — ABNORMAL LOW (ref 78.0–100.0)
MCV: 74.1 fL — ABNORMAL LOW (ref 78.0–100.0)
MCV: 74.1 fL — ABNORMAL LOW (ref 78.0–100.0)
Platelets: 142 10*3/uL — ABNORMAL LOW (ref 150–400)
Platelets: 104 10*3/uL — ABNORMAL LOW (ref 150–400)
Platelets: 120 10*3/uL — ABNORMAL LOW (ref 150–400)
Platelets: 129 10*3/uL — ABNORMAL LOW (ref 150–400)
Platelets: 220 10*3/uL (ref 150–400)
RBC: 4.99 MIL/uL (ref 3.87–5.11)
RBC: 4.91 MIL/uL (ref 3.87–5.11)
RDW: 14 % (ref 11.5–15.5)
RDW: 14.1 % (ref 11.5–15.5)
RDW: 14.5 % (ref 11.5–15.5)
RDW: 14.8 % (ref 11.5–15.5)
WBC: 16.6 10*3/uL — ABNORMAL HIGH (ref 4.0–10.5)
WBC: 13.5 10*3/uL — ABNORMAL HIGH (ref 4.0–10.5)
WBC: 14 10*3/uL — ABNORMAL HIGH (ref 4.0–10.5)
WBC: 20.5 10*3/uL — ABNORMAL HIGH (ref 4.0–10.5)

## 2010-08-29 LAB — COMPREHENSIVE METABOLIC PANEL
AST: 23 U/L (ref 0–37)
AST: 28 U/L (ref 0–37)
Alkaline Phosphatase: 82 U/L (ref 39–117)
BUN: 12 mg/dL (ref 6–23)
CO2: 27 mEq/L (ref 19–32)
CO2: 28 mEq/L (ref 19–32)
Chloride: 95 mEq/L — ABNORMAL LOW (ref 96–112)
Chloride: 99 mEq/L (ref 96–112)
Creatinine, Ser: 0.87 mg/dL (ref 0.4–1.2)
GFR calc Af Amer: >60 mL/min (ref >60)
GFR calc non Af Amer: >60 mL/min (ref >60)
Glucose, Bld: 119 mg/dL — ABNORMAL HIGH (ref 70–99)
Sodium: 133 mEq/L — ABNORMAL LOW (ref 135–145)
Total Bilirubin: 0.6 mg/dL (ref 0.3–1.2)
Total Bilirubin: 1.1 mg/dL (ref 0.3–1.2)
Total Protein: 7.1 g/dL (ref 6.0–8.3)

## 2010-08-29 LAB — ANAEROBIC CULTURE

## 2010-08-29 LAB — FUNGUS CULTURE W SMEAR: Fungal Smear: NONE SEEN

## 2010-08-29 LAB — DIFFERENTIAL
Basophils Absolute: 0.1 10*3/uL (ref 0.0–0.1)
Basophils Relative: 0 % (ref 0–1)
Basophils Relative: 0 % (ref 0–1)
Blasts: 0 %
Eosinophils Relative: 1 % (ref 0–5)
Lymphocytes Relative: 2 % — ABNORMAL LOW (ref 12–46)
Lymphocytes Relative: 3 % — ABNORMAL LOW (ref 12–46)
Lymphocytes Relative: 13 % (ref 12–46)
Lymphs Abs: 0.4 10*3/uL — ABNORMAL LOW (ref 0.7–4.0)
Lymphs Abs: 2.7 10*3/uL (ref 0.7–4.0)
Monocytes Absolute: 0.7 10*3/uL (ref 0.1–1.0)
Monocytes Absolute: 1.6 10*3/uL — ABNORMAL HIGH (ref 0.1–1.0)
Monocytes Relative: 8 % (ref 3–12)
Neutrophils Relative %: 77 % (ref 43–77)
Promyelocytes Absolute: 0 %
WBC Morphology: INCREASED
nRBC: 0 /100 WBC

## 2010-08-29 LAB — RAPID STREP SCREEN (MED CTR MEBANE ONLY): Streptococcus, Group A Screen (Direct): NEGATIVE

## 2010-08-29 LAB — FOLATE: Folate: 9.8 ng/mL

## 2010-08-29 LAB — URIC ACID: Uric Acid, Serum: 5.4 mg/dL (ref 2.4–7.0)

## 2010-08-29 LAB — LACTATE DEHYDROGENASE: LDH: 251 U/L — ABNORMAL HIGH (ref 94–250)

## 2010-08-29 LAB — CULTURE, ROUTINE-SINUS

## 2010-09-11 LAB — TROPONIN I: Troponin I: 0.02 ng/mL (ref 0.00–0.06)

## 2010-09-11 LAB — CK TOTAL AND CKMB (NOT AT ARMC): Total CK: 110 U/L (ref 7–177)

## 2010-11-01 NOTE — H&P (Signed)
Stacy Ashley, Stacy Ashley              ACCOUNT NO.:  [id number]   MEDICAL RECORD NO.:  [id number]          PATIENT TYPE:  AMB   LOCATION:  DAY                           FACILITY:  [facility]   PHYSICIAN:  Tilford Pillar, MD      DATE OF BIRTH:  [birth year]   DATE OF ADMISSION:  DATE OF DISCHARGE:  LH                              HISTORY & PHYSICAL   CHIEF COMPLAINT:  Bilateral ankle fatty tumors.   HISTORY OF PRESENT ILLNESS:  The patient is a 65 year old female who  presents with notable soft tissue masses on both lateral aspects of her  ankles.  These had been present for several years, states slowly  increased in size.  They have never had any drainage or any skin changes  noted with it.  She has had no fevers or chills.  She has denied any  joint problems or arthralgias.  She does state that they cause  difficulties with wearing shoes, has atypically caused pressure  sensation and rubbing sensation, and she does have some difficulty  walking secondarily to this.   PAST MEDICAL HISTORY:  None.   PAST SURGICAL HISTORY:  1. History of cesarean section.  2. Lumpectomy.   MEDICATIONS:  None.   ALLERGIES:  No known drug allergies.   SOCIAL HISTORY:  No tobacco.  No alcohol use.  No recreational drug use.   PERTINENT FAMILY HISTORY:  Positive for diabetes mellitus and pancreatic  cancer.   REVIEW OF SYSTEMS:  CONSTITUTIONAL: Unremarkable.  EYES:  Unremarkable.  EARS, NOSE, AND THROAT:  Occasional rhinorrhea.  RESPIRATORY:  Unremarkable.  CARDIOVASCULAR:  Unremarkable.  GASTROINTESTINAL:  Unremarkable.  MUSCULOSKELETAL:  Unremarkable.  GENITOURINARY:  Unremarkable.  SKIN:  As per HPI, otherwise, unremarkable.  ENDOCRINE:  Unremarkable.  NEURO:  Unremarkable.   PHYSICAL EXAMINATION:  GENERAL:  The patient is mildly obese-appearing  female in no acute distress.  She is alert and oriented x3.  HEENT:  Scalp, no deformities, no masses.  Eyes, pupils equal, round,  reactive.  Extraocular  movements are intact.  No conjunctival pallor is  noted.  Oral mucosa pink and normal occlusion.  NECK:  Trachea is midline.  No cervical lymphadenopathy is apparent.  PULMONARY:  Unlabored respirations.  No wheezes.  She is clear to  auscultation bilaterally.  CARDIOVASCULAR:  Regular rate and rhythm.  No murmurs, no gallops.  2+  radial, dorsalis pedis, and posterior tibialis pulses bilaterally.  ABDOMEN:  Positive bowel sounds.  Abdomen is soft and nontender.  EXTREMITIES:  Warm and dry.  SKIN:  Nondiaphoretic.  On her lower extremities, she does have of a  soft, mobile, nontender, soft tissue masses along the lateral malleolus  of both ankles.  This does not appear to be attached to the underlying  bone or muscle.  It does not appear to be involving the synovial space.   ASSESSMENT AND PLAN:  Bilateral lipomas:  At this point, I do agree that  she does have a bilateral soft tissue lipomas.  There is a low suspicion  that these are in communication with the joint space.  I did  discuss the  possibility of this with the patient and the possibility of a synovial  leak upon excision, although my suspicion is extremely above in regards  to this, and I did discuss with the patient continuing conservative  management versus surgical intervention.  Due to the at the patient's  difficulty with shoe placement on both sides, she does wish to proceed.  The risks, benefits, and alternatives of excision was discussed at  length with the patient including but not limited to risk of bleeding,  infection, recurrence as well as possibility of intraoperative cardiac  and pulmonary events as well as the difficulty with mobility with  addressing both of these at the same time.  The patient does understands  and does wish to proceed at this time.  We will proceed to the operating  room for excision of the bilateral soft tissue masses on both lateral  aspects of her lower extremities.      Tilford Pillar, MD  Electronically Signed     BZ/MEDQ  D:  12/23/2007  T:  12/24/2007  Job:  [id number]   cc:   Dr. Jen Mow, Primary Care Physician

## 2010-11-01 NOTE — Op Note (Signed)
Stacy Ashley, Stacy Ashley              ACCOUNT NO.:  [id number]   MEDICAL RECORD NO.:  [id number]          PATIENT TYPE:  AMB   LOCATION:  DAY                           FACILITY:  [facility]   PHYSICIAN:  Tilford Pillar, MD      DATE OF BIRTH:  03-03-1946   DATE OF PROCEDURE:  DATE OF DISCHARGE:                               OPERATIVE REPORT   PREOPERATIVE DIAGNOSIS:  Bilateral lateral malleolar soft tissue  lipomas.   POSTOPERATIVE DIAGNOSIS:  Bilateral lateral malleolar soft tissue  lipomas.   PROCEDURE:  Excision of bilateral ankle lipomas via 3 cm incision x2.   SURGEON:  Tilford Pillar, M.D.   ANESTHESIA:  General endotracheal.  Local anesthetic 0.5% Marcaine  plain.   SPECIMENS:  Lipomatous soft tissue from left and right lateral ankle.   ESTIMATED BLOOD LOSS:  Scant.   COMPLICATIONS:  None.   INDICATIONS:  The patient is a 65 year old female who presented to my  office as an outpatient with a history of soft tissue masses on both  ankles.  She is having these for several years, but they slowly got  larger in size and now she has found it difficult to walk or wear shoes  secondarily to the mass.  Conservative management was discussed with the  patient although due to her symptomatology I also did discuss possible  excision with her.  Risks, benefits, and alternatives were explained at  length with the patient including the possibility of synovial cyst  although my suspicion is extremely low in regard to this.  Her questions  and concerns were addressed and she consented understanding the risk of  bleeding, infection, and possible recurrence.   OPERATION:  The patient was taken to the operating room and was placed  in the supine position on the operating table, at which time the general  anesthetic was administered.  Once the patient was asleep, she was  endotracheally intubated by anesthesia.  At this point, her bilateral  feet and legs were prepped with Betadine solution.   Sterile dressings  were placed in standard fashion and then a skin incision was created  along the left lateral malleolus with a scalpel over the soft tissue  mass.  Dissection down to the subcuticular tissue was carried out using  combination of sharp Metzenbaum scissor dissection as well as  electrocautery dissection.  The capsule of the lipoma was identified.  This was somewhat lobulated and was excised in total.  It was not  attached to any underlying structures.  The soft tissue mass was removed  in total through a 3-cm incision and was sent as a permanent specimen to  pathology.  The wound was then irrigated.  Hemostasis was excellent and  the skin edges were reapproximated using a 3-0 Prolene suture in a  running continuous fashion.  The skin was then washed and dried with a  moist and dry towel and a sterile dressing was placed on the top for  initial coverage while attention was turned to the right lateral soft  tissue mass.  Similar incision was created over just anterior to the  right lateral malleolus with a scalpel.  Additional dissection down to  subcuticular tissue was carried out using electrocautery.  Again the  capsule of the lipoma was clearly identified.  A combination of sharp  and electrocautery dissection was utilized to dissect the lipoma from  the surrounding tissue.  This was removed again in total.  It was not  attached to any underlying structures and was sent as a permanent  specimen to pathology.  Again, the wound was irrigated after hemostasis  was obtained using electrocautery and the skin edge was reapproximated  using a 3-0 Prolene in a running continuous fashion.  Local anesthetic  was instilled on both sides and both  areas were washed and dried with  moist and dry towel.  Sterile dressings were placed and held in place  with a Coban dressing.  The drapes were removed.  The patient was  allowed to come out of general anesthetic.  She was transferred back  to  regular hospital bed and transferred to the Postanesthetic Care Unit in  stable condition.  At the conclusion of the procedure, all instrument,  sponge, and needle counts were correct.  The patient tolerated the  procedure well.      Tilford Pillar, MD  Electronically Signed     BZ/MEDQ  D:  12/25/2007  T:  12/26/2007  Job:  [id number]

## 2010-11-04 NOTE — Consult Note (Signed)
NAMEJARAE, Stacy Ashley                          ACCOUNT NO.:  [id number]   MEDICAL RECORD NO.:  [id number]                   PATIENT TYPE:  EMS   LOCATION:  ED                                   FACILITY:  [facility]   PHYSICIAN:  Hanley Hays. Dechurch, M.D.           DATE OF BIRTH:  [birth year]   DATE OF CONSULTATION:  08/16/2002  DATE OF DISCHARGE:                                   CONSULTATION   REASON FOR CONSULTATION:  Right lower extremity pain and edema, question  deep venous thrombosis.   HISTORY OF PRESENT ILLNESS:  The patient is a 65 year old, healthy, African  American female on no medications or over the counter products, who noted  some right thigh and knee pain about two weeks ago that has persisted and  worsened to the point that she is unable to bend over to dress herself.  She  notes that the pain is much worse with movements of initiation.  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  She  noted swelling in her right knee, which is worse, which is one of the  reasons she presented to the emergency room.  She has a history of right  knee surgery (arthroscopy) several years ago, but the knee has not been  causing any problems recently.  She has had no history of trauma.  She has  had no history of previous DVT, phlebitis, or blood clots.  There is no  family history of either also.  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  The only thing she notes differently  is that she has gained 50 pounds in the last six months, her trousers fit  differently and are tighter, and she is not able to straighten out her leg  as much.  I doubt whether this is playing a role.   REVIEW OF SYSTEMS:  The patient has no GI, GU, respiratory, cardiovascular,  neurologic, endocrine, or other findings in her review of systems.   FAMILY MEDICAL HISTORY:  No history of blood disorders.  Diabetes in aunts.  Pancreatic  cancer in her father who died at age 34.  Her mother has  diabetes.  No history of clotting problems, pulmonary emboli, blood clots,  etc.   PAST SURGICAL HISTORY:  Right knee arthroscopy.   PAST MEDICAL HISTORY:  Essentially healthy.  She is menopausal.  No history  of pregnancy.   MEDICATIONS:  None, although she did take two days of glucosamine and  calcium for her knee.   ALLERGIES:  None known.   PHYSICAL EXAMINATION:  GENERAL APPEARANCE:  An obese black female in no  acute distress.  She is alert and appropriate.  VITAL SIGNS:  The blood pressure is 130/70, pulse 60 and regular, and  respirations unlabored.  NEUROLOGIC:  Fully intact.  NECK:  Supple.  No JVD.  LUNGS:  Clear to auscultation, though diminished.  HEENT:  The oropharynx is moist.  HEART:  Regular rate and rhythm.  No murmur, rub, or gallop.  ABDOMEN:  Obese, soft, and nontender.  EXTREMITIES:  No edema is noted.  The exam is symmetric.  No induration is  noted.  There is no evidence of effusion of the knee on either side.  The  patient has pain in the medial aspect of the left groin with straight leg  and flexed knee raise.  No real palpable pain though per se.  No masses and  no adenopathy are noted.  Dorsalis pedis and posterior tibial pulses are  intact bilaterally.  Again, there is no edema present.   LABORATORY DATA:  CBC:  Hemoglobin 9, hematocrit 12.8, platelets 284, normal  differential.  BMP normal.  A D-dimer is 0.53.   ASSESSMENT AND PLAN:  Right lower extremity pain with equivocal D-dimer,  clinically not impressive for deep venous thrombosis particularly given the  duration of symptoms.  However, given her history, the patient was  transported to [facility] for Dopplers of the right thigh, which  fortunately proved no evidence of DVT bilaterally or other significant  findings.  The patient is felt to have tendonitis of the right hip.  The  patient's findings were discussed at length.  She  has no risk factors for  ibuprofen, therefore she is being discharged with 400 mg t.i.d. for one week  and then p.r.n.  Darvocet p.r.n. #30 was given as well.  She was advised not  to utilize this medication if on duty driving and that it was considered a  narcotic.  The patient was also advised on the side effects of ibuprofen.  She is asked to follow up and establish a primary Hesper Venturella if not improved.  She was also given the names of several physicians in the area taking  patients, including Dr. Micah Flesher of [facility].  She was  also given Dr. Tenna Delaine name in order to be evaluated should the pain  persists.  Given her schedule, it is probably hard for her to make  appointments and this needs to be taken into consideration.  She is  discharged to home in stable condition with good understanding.                                               Hanley Hays Stacy Ashley, M.D.    FED/MEDQ  D:  08/16/2002  T:  08/16/2002  Job:  [id number]

## 2010-11-04 NOTE — Op Note (Signed)
NAMENELVA, Stacy Ashley              ACCOUNT NO.:  [id number]   MEDICAL RECORD NO.:  [id number]          PATIENT TYPE:  AMB   LOCATION:  DAY                           FACILITY:  [facility]   PHYSICIAN:  R. Roetta Sessions, M.D. DATE OF BIRTH:  [birth year]   DATE OF PROCEDURE:  10/15/2006  DATE OF DISCHARGE:                               OPERATIVE REPORT   PROCEDURE:  Screening colonoscopy, biopsy.   INDICATIONS FOR PROCEDURE:  The patient is a 65 year old African-  American female __________ symptoms, positive family history of colon  cancer in a second-degree relative (aunt) at a young age, who reportedly  has a history of colonic polyps with her last colonoscopy in [place]  some 10 years ago.  She is referred by Dr. Vickey Sages in [place],  [place], for colonoscopy.  This approach has been discussed with Ms.  Nesmith at length.  Potential risks, benefits, and alternatives have  been reviewed, questions answered; she is agreeable.  Please see  documentation in medical record.   PROCEDURE NOTE:  O2 saturations, blood pressure, pulse, and respirations  were monitored throughout the entire procedure.   CONSCIOUS SEDATION:  1. Versed 5 mg IV.  2. Demerol 125 mg IV in divided doses.   INSTRUMENT:  Pentax video chip system.   FINDINGS:  Digital rectal exam revealed no abnormalities.   ENDOSCOPIC FINDINGS:  The prep was adequate.  Examination of colonic  mucosa was undertaken from the rectosigmoid junction through the left  transverse right colon to the area of the appendiceal orifice, ileocecal  valve, and cecum.  These structures were well seen and photographed for  the record.  Terminal ileum was intubated to 5 cm.  From this level, the  scope was slowly withdrawn.  All previously mentioned mucosal surfaces  were again seen.  The patient was noted to have left-sided diverticula.  The remainder of the colonic mucosa and terminal ileum mucosa appeared  normal.  The scope was pulled  down in the rectum where a thorough  examination of the rectal mucosa including retroflexed view of the anal  verge demonstrated a diminutive 3 mm polyp at 5 cm from the anal verge.  This lesion was cold/biopsied, removed.  The patient tolerated the  procedure well and was reacted in endoscopy.   IMPRESSION:  1. Diminutive rectal polyp, cold biopsied/removed.  2. Otherwise, normal rectum.  3. Left-sided diverticulum.  4. Remainder of colonic mucosa appeared normal.  5. Normal terminal ileum.   RECOMMENDATIONS:  1. Follow up on pathology.  2. Diverticulosis.  Literature provided to [name].  3. Further recommendations to follow.      Jonathon Bellows, M.D.  Electronically Signed     RMR/MEDQ  D:  10/15/2006  T:  10/15/2006  Job:  [id number]   cc:   Gennie Alma Dr. Vickey Sages

## 2010-11-09 ENCOUNTER — Telehealth: Payer: Self-pay | Admitting: Orthopedic Surgery

## 2010-11-09 NOTE — Telephone Encounter (Signed)
Stacy Ashley called today asking for another appointment here for left wrist pain.  Told her that per the telephone call 05/24/10 asking for further treatment, you did not feel that you could offer any further recommendations, but did refer her to Dr. Amanda Pea.  Dr. Amanda Pea declined the referral. She asked today if you will prescribe some pain medicine as her wrist hurts some during the day, but mostly at night.  She uses [facility] in [place]

## 2010-11-11 ENCOUNTER — Telehealth: Payer: Self-pay | Admitting: Orthopedic Surgery

## 2010-11-11 NOTE — Telephone Encounter (Signed)
  She can have ultracet 1-2 tablets q 4 hrs prn pain # 40 5 refills

## 2010-11-11 NOTE — Telephone Encounter (Addendum)
Rx phoned in to [facility] in [place] ph [phone], spoke with pharmacist, Thayer Ohm; called back to patient to notify.

## 2011-03-16 LAB — CBC
HCT: 38.9
Hemoglobin: 12.5
MCV: 77 — ABNORMAL LOW
Platelets: 236
RBC: 5.05
WBC: 10.3

## 2011-03-16 LAB — BASIC METABOLIC PANEL
Chloride: 110
GFR calc Af Amer: >60
GFR calc non Af Amer: >60
Potassium: 3.9
Sodium: 141

## 2011-03-16 LAB — DIFFERENTIAL
Basophils Absolute: 0.1
Basophils Relative: 1
Eosinophils Absolute: 0.3
Monocytes Absolute: 0.5
Neutro Abs: 6.6

## 2011-03-17 LAB — WOUND CULTURE

## 2011-03-25 ENCOUNTER — Emergency Department (HOSPITAL_COMMUNITY): Payer: Self-pay

## 2011-03-25 ENCOUNTER — Emergency Department (HOSPITAL_COMMUNITY)
Admission: EM | Admit: 2011-03-25 | Discharge: 2011-03-25 | Disposition: A | Discharge status: Home / Self Care | Payer: Self-pay | Attending: Emergency Medicine | Admitting: Emergency Medicine

## 2011-03-25 ENCOUNTER — Encounter: Payer: Self-pay | Admitting: *Deleted

## 2011-03-25 DIAGNOSIS — M79606 Pain in leg, unspecified: Secondary | ICD-10-CM

## 2011-03-25 DIAGNOSIS — M79609 Pain in unspecified limb: Secondary | ICD-10-CM | POA: Insufficient documentation

## 2011-03-25 HISTORY — DX: Carpal tunnel syndrome, unspecified upper limb: G56.00

## 2011-03-25 LAB — BASIC METABOLIC PANEL
CO2: 29 mEq/L (ref 19–32)
Calcium: 9 mg/dL (ref 8.4–10.5)
Chloride: 106 mEq/L (ref 96–112)
Glucose, Bld: 96 mg/dL (ref 70–99)
Sodium: 141 mEq/L (ref 135–145)

## 2011-03-25 LAB — CBC
HCT: 33.7 % — ABNORMAL LOW (ref 36.0–46.0)
MCH: 24.7 pg — ABNORMAL LOW (ref 26.0–34.0)
MCV: 75.1 fL — ABNORMAL LOW (ref 78.0–100.0)
Platelets: 227 10*3/uL (ref 150–400)
RBC: 4.49 MIL/uL (ref 3.87–5.11)

## 2011-03-25 MED ORDER — OXYCODONE-ACETAMINOPHEN 5-325 MG PO TABS
1.0000 | ORAL_TABLET | Freq: Once | ORAL | Status: AC
  Administered 2011-03-25: 1 via ORAL
  Filled 2011-03-25: qty 1

## 2011-03-25 MED ORDER — DEXAMETHASONE SODIUM PHOSPHATE 10 MG/ML IJ SOLN
10.0000 mg | Freq: Once | INTRAMUSCULAR | Status: AC
  Administered 2011-03-25: 10 mg via INTRAVENOUS

## 2011-03-25 MED ORDER — OXYCODONE-ACETAMINOPHEN 5-325 MG PO TABS: 1.0000 | ORAL_TABLET | ORAL | Status: AC | PRN

## 2011-03-25 MED ORDER — PREDNISONE 10 MG PO TABS: ORAL_TABLET | ORAL | Status: DC

## 2011-03-25 MED ORDER — DEXAMETHASONE SODIUM PHOSPHATE 4 MG/ML IJ SOLN
INTRAMUSCULAR | Status: AC
  Filled 2011-03-25: qty 3
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
## 2011-03-25 NOTE — ED Notes (Signed)
Pt requesting "meal from the kitchen" Sec ordered meal tray. PA aware. NAD>

## 2011-03-31 LAB — DIFFERENTIAL
Basophils Absolute: 0
Basophils Relative: 0
Eosinophils Absolute: 0.2
Eosinophils Relative: 3
Neutrophils Relative %: 80 — ABNORMAL HIGH

## 2011-03-31 LAB — CBC
HCT: 36.1
MCHC: 32.4
MCV: 74.4 — ABNORMAL LOW
Platelets: 255
RDW: 14.6 — ABNORMAL HIGH
WBC: 9

## 2011-03-31 LAB — POCT CARDIAC MARKERS
Myoglobin, poc: 85.5
Operator id: 208401

## 2011-03-31 LAB — BASIC METABOLIC PANEL
BUN: 8
CO2: 27
Chloride: 107
Creatinine, Ser: 0.86
Glucose, Bld: 111 — ABNORMAL HIGH
Potassium: 3.4 — ABNORMAL LOW
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
## 2011-04-02 IMAGING — CT CT HEAD W/O CM
1 of 2 series · 14 of 30 positions shown, 18 images · non-contrast
Comparison: 06/02/2010.

CLINICAL DATA: Sinus protocol.  Sinus disease.

CT HEAD WITHOUT CONTRAST
TECHNIQUE: Contiguous axial images were obtained from the base of
the skull through the vertex without contrast.

[Series 5: stealth sinus palate thru fro · axial · 0.59mm/px · z∈[+64,+167]mm · 14 of 96 slices shown, 18 images]
[im 7/96  brain]
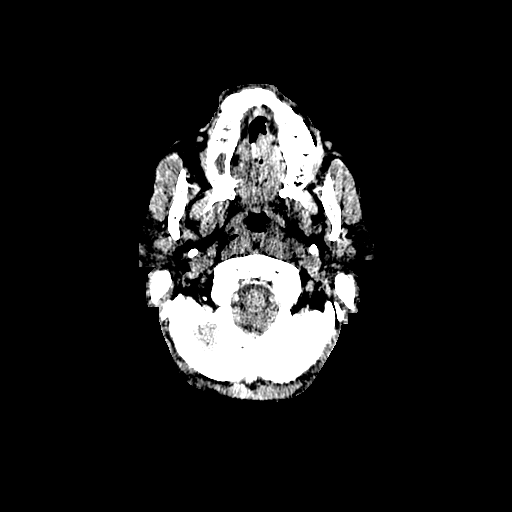
[im 7/96  bone]
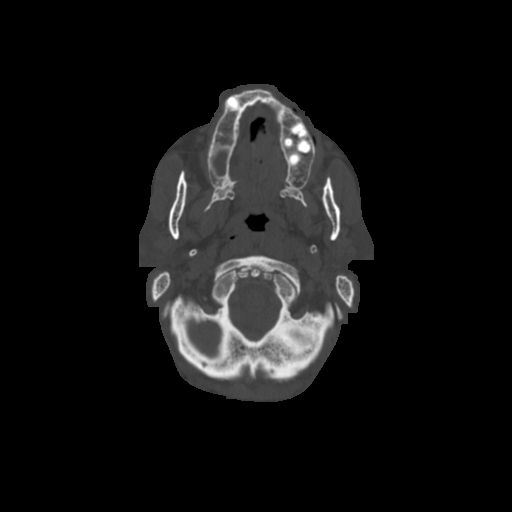
[im 13/96  brain]
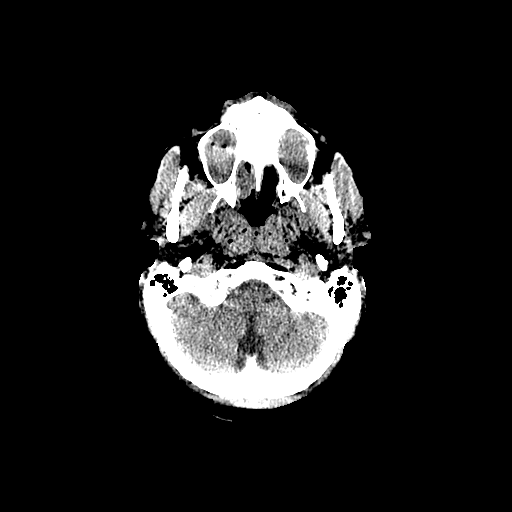
[im 20/96  brain]
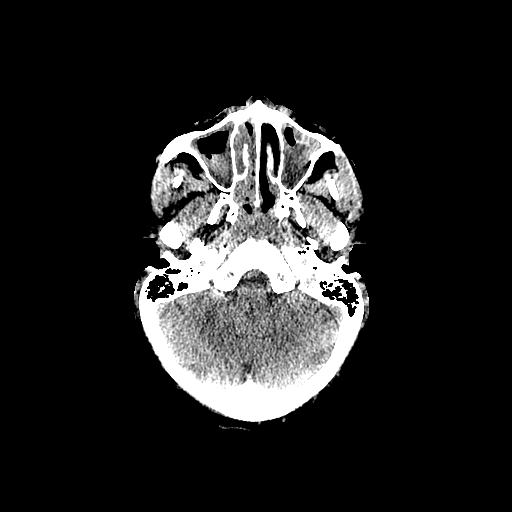
[im 26/96  brain]
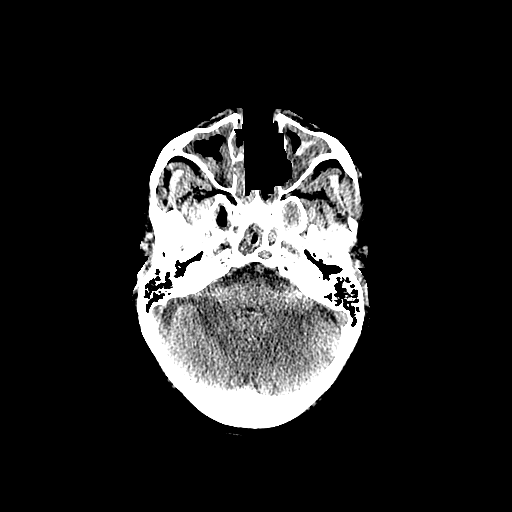
[im 32/96  brain]
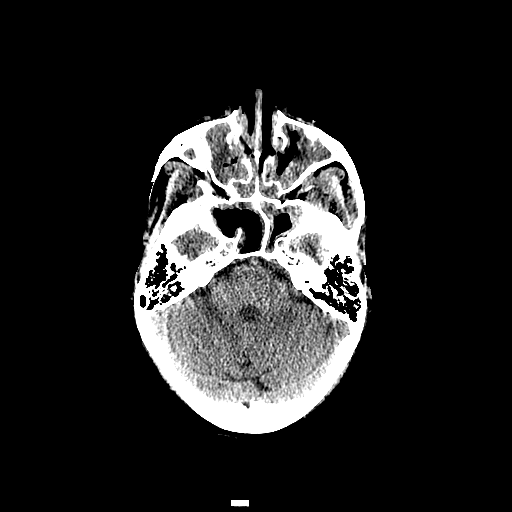
[im 32/96  bone]
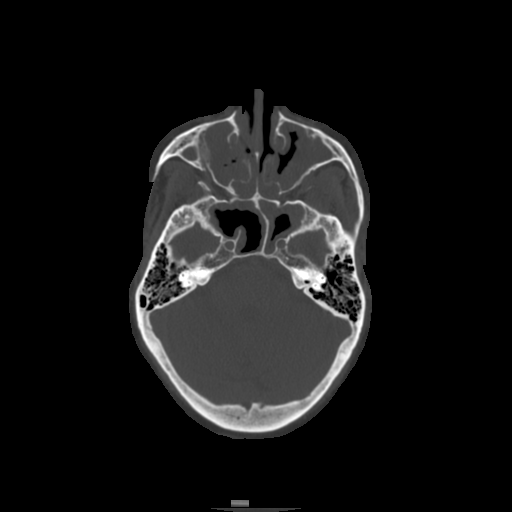
[im 39/96  brain]
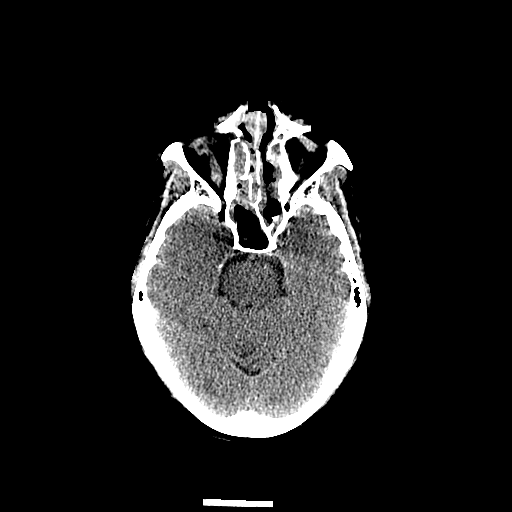
[im 45/96  brain]
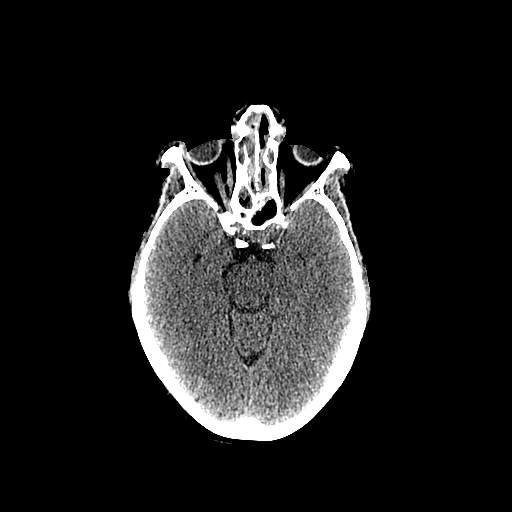
[im 51/96  brain]
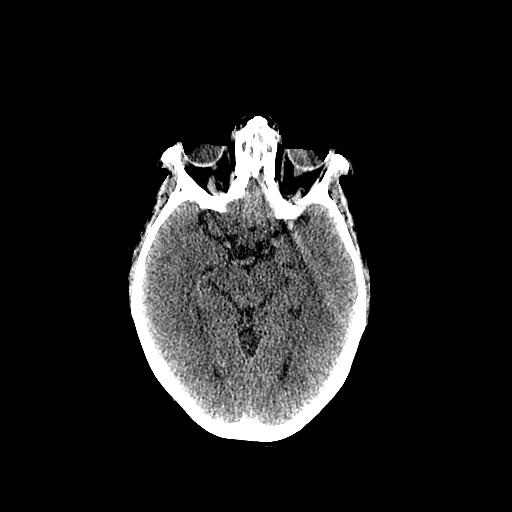
[im 58/96  brain]
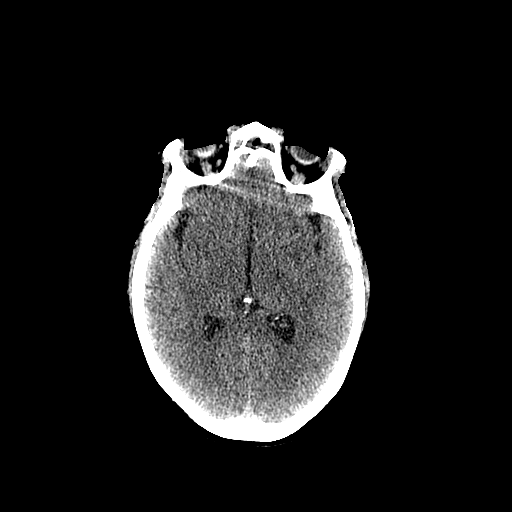
[im 58/96  bone]
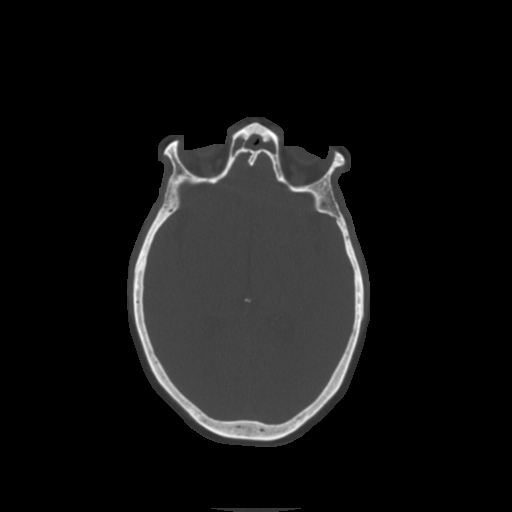
[im 64/96  brain]
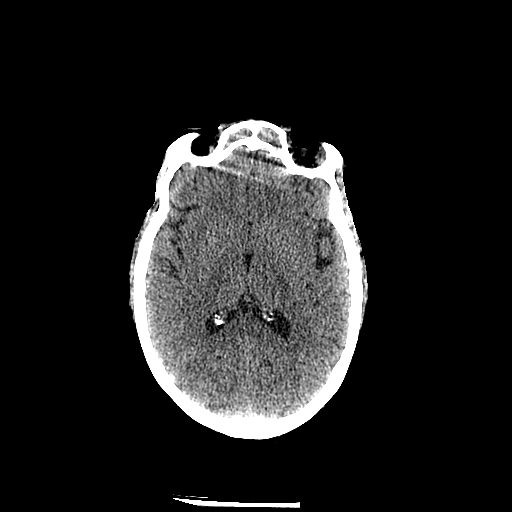
[im 70/96  brain]
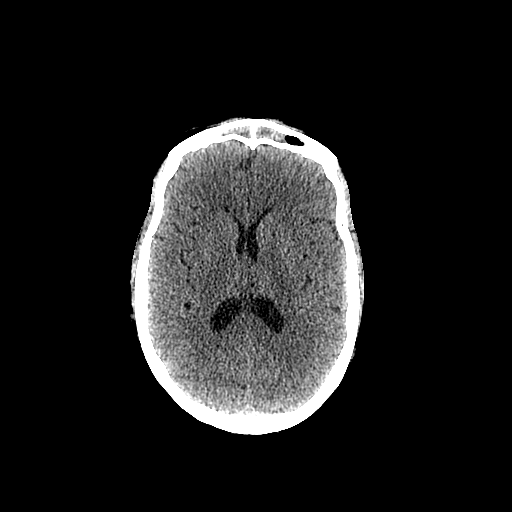
[im 77/96  brain]
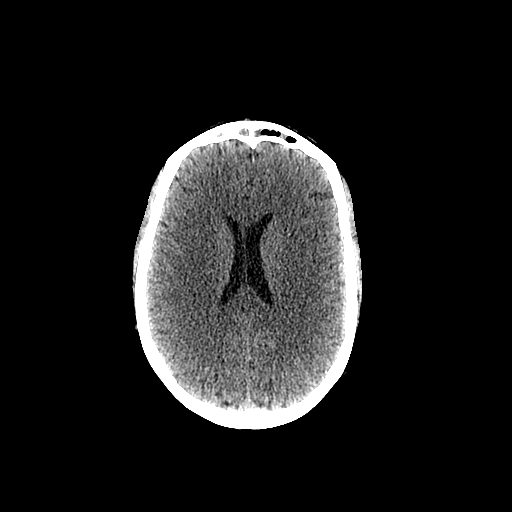
[im 83/96  brain]
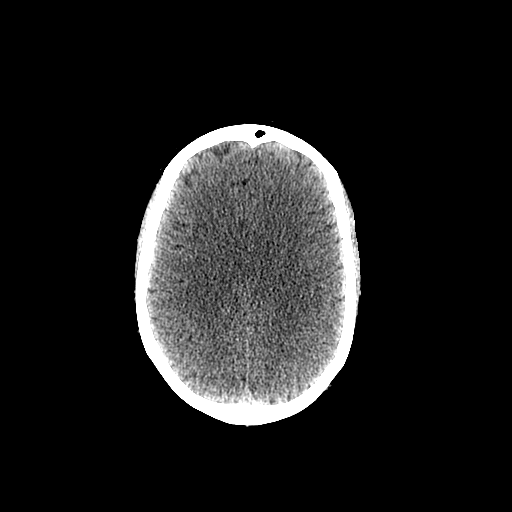
[im 83/96  bone]
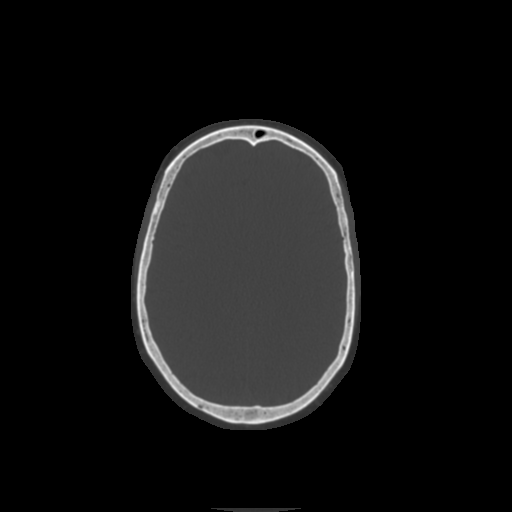
[im 89/96  brain]
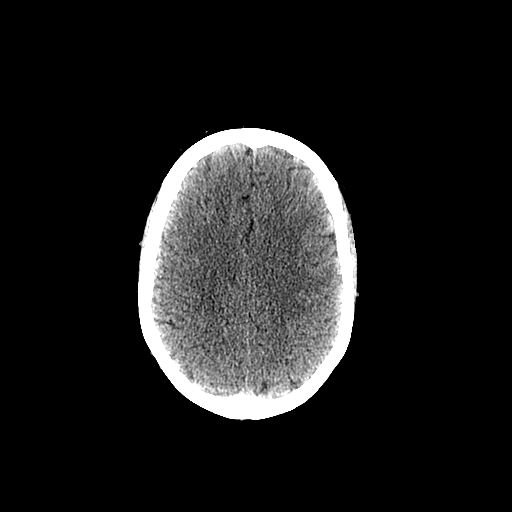

[14 of 30 positions shown; findings below may reference images not displayed]

FINDINGS: Stealth protocol was performed without contrast.  On
initial sequence, technologist questioned movement and repeated
this sequence.  There are therefore two sequences.  This has been
relayed to the operating room by Elizabet Neuman CT technologist.

Complete opacification of the right frontal sinus.  Almost complete
opacification left frontal sinus.

Almost complete opacification ethmoid sinus air cells.

Moderate mucosal thickening sphenoid sinus air cells and upper
aspect of the aerated portion of the pterygoid plates.  The
sphenoid septum is deviated to the left.  The carotid canal
projects into the posterior lateral aspect of the sphenoid sinus
air cells.  A tiny area of dehiscence may be present on the left.

Prominent mucosal thickening with almost complete opacification
maxillary sinuses.

Increased number of parotid lymph nodes and lymph node in the upper
neck.

No obvious intracranial extension or orbital extension of sinus
disease noted.
IMPRESSION: Stealth protocol was performed without contrast.  On initial
sequence, technologist questioned movement and repeated this
sequence.  There are therefore two sequences.  This has been
relayed to the operating room by Elizabet Neuman CT technologist.

Complete opacification of the right frontal sinus.  Almost complete
opacification left frontal sinus.

Almost complete opacification ethmoid sinus air cells.

Moderate mucosal thickening sphenoid sinus air cells and upper
aspect of the aerated portion of the pterygoid plates.  The
sphenoid septum is deviated to the left.  The carotid canal
projects into the posterior lateral aspect of the sphenoid sinus
air cells.  A tiny area of dehiscence may be present on the left.

Prominent mucosal thickening with almost complete opacification
maxillary sinuses.

## 2011-04-05 NOTE — ED Provider Notes (Signed)
Medical screening examination/treatment/procedure(s) were performed by non-physician practitioner and as supervising physician I was immediately available for consultation/collaboration.   Eurika Sandy L Lilybelle Mayeda, MD 04/05/11 1739 

## 2011-05-02 ENCOUNTER — Ambulatory Visit (HOSPITAL_COMMUNITY)
Admission: RE | Admit: 2011-05-02 | Discharge: 2011-05-02 | Disposition: A | Discharge status: Home / Self Care | Payer: Self-pay | Source: Ambulatory Visit | Attending: Chiropractic Medicine | Admitting: Chiropractic Medicine

## 2011-05-02 DIAGNOSIS — M25629 Stiffness of unspecified elbow, not elsewhere classified: Secondary | ICD-10-CM | POA: Insufficient documentation

## 2011-05-02 DIAGNOSIS — IMO0001 Reserved for inherently not codable concepts without codable children: Secondary | ICD-10-CM | POA: Insufficient documentation

## 2011-05-02 DIAGNOSIS — M25529 Pain in unspecified elbow: Secondary | ICD-10-CM | POA: Insufficient documentation

## 2011-05-02 DIAGNOSIS — M6281 Muscle weakness (generalized): Secondary | ICD-10-CM | POA: Insufficient documentation
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
## 2011-05-03 ENCOUNTER — Encounter (HOSPITAL_COMMUNITY): Payer: Self-pay | Admitting: *Deleted

## 2011-05-03 ENCOUNTER — Emergency Department (HOSPITAL_COMMUNITY)
Admission: EM | Admit: 2011-05-03 | Discharge: 2011-05-03 | Disposition: A | Discharge status: Home / Self Care | Payer: Self-pay | Attending: Emergency Medicine | Admitting: Emergency Medicine

## 2011-05-03 DIAGNOSIS — R110 Nausea: Secondary | ICD-10-CM | POA: Insufficient documentation

## 2011-05-03 DIAGNOSIS — H53149 Visual discomfort, unspecified: Secondary | ICD-10-CM | POA: Insufficient documentation

## 2011-05-03 DIAGNOSIS — G5600 Carpal tunnel syndrome, unspecified upper limb: Secondary | ICD-10-CM | POA: Insufficient documentation

## 2011-05-03 DIAGNOSIS — R51 Headache: Secondary | ICD-10-CM | POA: Insufficient documentation

## 2011-05-03 MED ORDER — DIPHENHYDRAMINE HCL 50 MG/ML IJ SOLN
25.0000 mg | Freq: Once | INTRAMUSCULAR | Status: AC
  Administered 2011-05-03: 25 mg via INTRAVENOUS
  Filled 2011-05-03: qty 1

## 2011-05-03 MED ORDER — SODIUM CHLORIDE 0.9 % IV BOLUS (SEPSIS)
1000.0000 mL | Freq: Once | INTRAVENOUS | Status: AC
  Administered 2011-05-03: 1000 mL via INTRAVENOUS

## 2011-05-03 MED ORDER — BUTALBITAL-APAP-CAFFEINE 50-325-40 MG PO TABS: 1.0000 | ORAL_TABLET | Freq: Four times a day (QID) | ORAL | Status: DC | PRN

## 2011-05-03 MED ORDER — METOCLOPRAMIDE HCL 5 MG/ML IJ SOLN
20.0000 mg | Freq: Once | INTRAVENOUS | Status: AC
  Administered 2011-05-03: 20 mg via INTRAVENOUS
  Filled 2011-05-03: qty 4

## 2011-05-03 MED ORDER — KETOROLAC TROMETHAMINE 30 MG/ML IJ SOLN
30.0000 mg | Freq: Once | INTRAMUSCULAR | Status: AC
  Administered 2011-05-03: 30 mg via INTRAVENOUS
  Filled 2011-05-03: qty 1

## 2011-05-03 NOTE — ED Notes (Signed)
Pt c/o severe headache with nausea that started x 1 day

## 2011-05-03 NOTE — ED Notes (Signed)
MD at bedside. 

## 2011-05-03 NOTE — ED Provider Notes (Signed)
History     CSN: [id number] Arrival date & time: 05/03/2011 12:43 PM   First MD Initiated Contact with Patient 05/03/11 1313      Chief Complaint  Patient presents with  . Migraine  . Nausea    (Consider location/radiation/quality/duration/timing/severity/associated sxs/prior treatment) HPI The patient presents with headache. She notes the gradual onset yesterday, approximately 30 hours ago. Since onset the headache is been persistent, worsening. Today, she also gradually developed increasing nausea. No emesis. The patient describes the headache is throbbing, posterior orbital, frontal. It is otherwise nonradiating. No relief with oral narcotics, or by mouth intake. Headache is worse with bright lights. No disorientation, confusion, ataxia. The patient has history of migraines, notes that she has had a CT scan within the past year. She denies any recent headaches similar to today, though. Past Medical History  Diagnosis Date  . Carpal tunnel syndrome     Past Surgical History  Procedure Date  . Cesarean section   . Lymphectomy   . Tubal ligation     History reviewed. No pertinent family history.  History  Substance Use Topics  . Smoking status: Never Smoker   . Smokeless tobacco: Not on file  . Alcohol Use: No    OB History    Grav Para Term Preterm Abortions TAB SAB Ect Mult Living                  Review of Systems  Constitutional: Negative for fever and chills.  Eyes: Positive for photophobia.  Respiratory: Negative.   Cardiovascular: Negative.   Gastrointestinal: Positive for nausea.  Genitourinary: Negative.   Musculoskeletal: Negative.   Skin: Negative.   Neurological: Positive for headaches.  Hematological: Negative.   Psychiatric/Behavioral: Negative.     Allergies  Review of patient's allergies indicates no known allergies.  Home Medications   Current Outpatient Rx  Name Route Sig Dispense Refill  . CALCIUM 600 PO Oral Take 2 tablets by  mouth daily.      Marland Kitchen VITAMIN D3 1000 UNITS PO TABS Oral Take 1,000 Units by mouth daily.      . OXYCODONE-ACETAMINOPHEN 5-325 MG PO TABS Oral Take 1 tablet by mouth every 4 (four) hours as needed. Pain       BP 136/77  Pulse 66  Temp(Src) 98.3 F (36.8 C) (Oral)  Resp 20  Ht 5\' 8"  (1.727 m)  Wt 233 lb (105.688 kg)  BMI 35.43 kg/m2  SpO2 100%  Physical Exam  Constitutional: She is oriented to person, place, and time. She appears well-developed and well-nourished.       Patient is shielding her eyes from the light, otherwise in no distress, lying comfortably in bed.  HENT:  Head: Normocephalic and atraumatic.  Eyes: EOM are normal.  Cardiovascular: Normal rate and regular rhythm.   Pulmonary/Chest: Effort normal and breath sounds normal.  Abdominal: She exhibits no distension.  Musculoskeletal: She exhibits no edema and no tenderness.  Neurological: She is alert and oriented to person, place, and time. No cranial nerve deficit. She exhibits normal muscle tone. Coordination normal.  Skin: Skin is warm and dry.    ED Course  Procedures (including critical care time)  Labs Reviewed - No data to display No results found.   No diagnosis found.    MDM  This 65 year old female with history of migraine headaches now presents with headache. On exam the patient is in no distress though she appears uncomfortable. She has no focal neurologic deficits. Following fluids, medications, the  patient was noted to feel "much better". She was discharged to follow up with her primary care physician.        Gerhard Munch, MD 05/03/11 1520

## 2011-05-03 NOTE — ED Notes (Signed)
Patient c/o headache x 1 day. History of migraines. Denies vision changes, reports nausea and photosensitivity. Patient alert/oriented x 4. Denies any other needs at present. Awaiting MD eval.

## 2011-05-09 ENCOUNTER — Ambulatory Visit (HOSPITAL_COMMUNITY)
Admission: RE | Admit: 2011-05-09 | Payer: Self-pay | Source: Ambulatory Visit | Attending: *Deleted | Admitting: *Deleted

## 2011-05-16 ENCOUNTER — Ambulatory Visit (HOSPITAL_COMMUNITY)
Admission: RE | Admit: 2011-05-16 | Discharge: 2011-05-16 | Payer: Self-pay | Source: Ambulatory Visit | Attending: *Deleted | Admitting: *Deleted

## 2011-05-16 DIAGNOSIS — M6281 Muscle weakness (generalized): Secondary | ICD-10-CM

## 2011-05-16 DIAGNOSIS — M25539 Pain in unspecified wrist: Secondary | ICD-10-CM

## 2011-05-16 NOTE — Progress Notes (Signed)
Occupational Therapy Treatment  Patient Details  Name: FABIANNA KEATS MRN: [id number] Date of Birth: 07-Jul-1945  Today's Date: 05/16/2011 Time: 1309-1400 Time Calculation (min): 51 min Manual Therapy [id number] 22' Therapeutic Exercises 1332-1350 18' Heat 10' Visit#: 2  of 12   Re-eval: 05/30/11    Subjective Symptoms/Limitations Symptoms: S:  The right one is giving me a fit today. Pain Assessment Pain Score:   6 Pain Location: Wrist Pain Orientation: Right  Exercise/Treatments Wrist Exercises Forearm Supination: PROM;AROM;10 reps Forearm Pronation: PROM;AROM;10 reps Wrist Flexion: PROM;AROM;10 reps Wrist Extension: PROM;AROM;10 reps   Sponges: right:  10, 11 left 14, 15 Theraputty: Flatten;Roll;Grip;Locate Pegs (yellow) Theraputty - Flatten: yellow Theraputty - Roll: yellow Theraputty - Grip: yellow Theraputty - Locate Pegs: yellow   Manual Therapy Manual Therapy: Myofascial release Myofascial Release: MFR and manual stretching to bilateral flexor and extensor forearms, wrists, and hands to increase P/AROM and decrease pain.  [id number] Moist Heat Therapy Number Minutes Moist Heat: 10 Minutes Moist Heat Location: Hand;Wrist (bilateral) Modalities Modalities: Moist Heat Occupational Therapy Assessment and Plan OT Assessment and Plan Clinical Impression Statement: A:  Pain a 6/10.  MFR did not help to alleviate pain.  ROM is full and not restricted during MFR.   OT Plan: P:  Add gentle strengthening.    Goals Short Term Goals Time to Complete Short Term Goals: 3 weeks Short Term Goal 1: Patient will be independenet with HEP. Short Term Goal 1 Progress: Progressing toward goal Short Term Goal 2: Patient will increase PROM in bilateral wrists by 10 for increased abillity to complete daily activities. Short Term Goal 2 Progress: Progressing toward goal Short Term Goal 3: Patient will increase bilateral wrist and forearm strength to 4+/5 for increased ability to  pick up her children. Short Term Goal 3 Progress: Progressing toward goal Short Term Goal 4: Patient will increase bilateral grip strength by 10 pounds and pinch by 2 pounds for increased ability to open containers. Short Term Goal 4 Progress: Progressing toward goal Short Term Goal 5: Patient will decrease pain to 3/10 in her wrists when completing functional activities. Short Term Goal 5 Progress: Progressing toward goal Long Term Goals Time to Complete Long Term Goals: Other (comment) (6 weeks) Long Term Goal 1: Patient will return to prior level of I with all B/IADL, work, and leisure activities. Long Term Goal 1 Progress: Progressing toward goal Long Term Goal 2: Patient will increase bilateral wrist AROM to WNL for increased ability to care for her children. Long Term Goal 2 Progress: Progressing toward goal Long Term Goal 3: Patient will increase bilateral wrist strength to 5/5 for increased ability to pick up her foster children. Long Term Goal 4: Patient will decrease fascial restrictions to trace in her wrists. Long Term Goal 4 Progress: Progressing toward goal Long Term Goal 5: Patient will decrease pain to 1/10 in her wrists while working on the computer. Long Term Goal 5 Progress: Progressing toward goal Additional Long Term Goals?: Yes Long Term Goal 6: Patient will increase bilateral grip strength by 20 pounds and pinch strength by 4 pounds for increased ability to open containers. Long Term Goal 6 Progress: Progressing toward goal End of Session Patient Active Problem List  Diagnoses  . 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 MORBID OBESITY  . CELLULITIS, LEG, RIGHT  . HIDRADENITIS  . CLOSED FRACTURE OF BASE OF OTHER METACARPAL BONE  . Pain in wrist joint  . Muscle weakness (generalized)   End of Session  Activity Tolerance: Patient tolerated treatment well General Behavior During Session: [facility] for tasks performed Cognition: [facility] for tasks  performed   Shirlean Mylar, OTR/L  05/16/2011, 2:20 PM

## 2011-05-23 ENCOUNTER — Ambulatory Visit (HOSPITAL_COMMUNITY)
Admission: RE | Admit: 2011-05-23 | Discharge: 2011-05-23 | Disposition: A | Discharge status: Home / Self Care | Payer: Self-pay | Source: Ambulatory Visit | Attending: Chiropractic Medicine | Admitting: Chiropractic Medicine

## 2011-05-23 ENCOUNTER — Telehealth (HOSPITAL_COMMUNITY): Payer: Self-pay

## 2011-05-23 DIAGNOSIS — M6281 Muscle weakness (generalized): Secondary | ICD-10-CM

## 2011-05-23 DIAGNOSIS — M25539 Pain in unspecified wrist: Secondary | ICD-10-CM

## 2011-05-23 DIAGNOSIS — M25629 Stiffness of unspecified elbow, not elsewhere classified: Secondary | ICD-10-CM | POA: Insufficient documentation

## 2011-05-23 DIAGNOSIS — M25529 Pain in unspecified elbow: Secondary | ICD-10-CM | POA: Insufficient documentation

## 2011-05-23 DIAGNOSIS — IMO0001 Reserved for inherently not codable concepts without codable children: Secondary | ICD-10-CM | POA: Insufficient documentation

## 2011-05-23 NOTE — Progress Notes (Signed)
Occupational Therapy Treatment  Patient Details  Name: Stacy Ashley MRN: [id number] Date of Birth: [birth year]  Today's Date: 05/23/2011 Time: 1110-1201 Time Calculation (min): 51 min Visit#: 3  of 12   Re-eval: 05/31/11  Manual Therapy  [id number]  28' Therapeutic Exercise 4782-9562  22'  Subjective Symptoms/Limitations Symptoms: S:  I had to take a pain pill today.  It was a 9 this morning. Pain Assessment Currently in Pain?: Yes  Exercise/Treatments   Theraputty: Flatten;Roll;Grip;Locate Pegs Theraputty - Flatten: yellow Theraputty - Roll: yellow Theraputty - Grip: yellow Theraputty - Locate Pegs: yellow Sponges: left 30, 31 right 17,17 Manual Therapy Myofascial Release: MFR and manual stretching to bilateral flexor and extensor forearms, wrists and hands to increase P/AROM and decrese pain.  Occupational Therapy Assessment and Plan OT Assessment and Plan Clinical Impression Statement: A:  No change in pain, encouraged patient to ice forearm at home to help decrease soreness and pain. Rehab Potential: Excellent OT Plan: P: Attempt 1# to ther-ex.   Goals Short Term Goals Time to Complete Short Term Goals: 3 weeks Short Term Goal 1: Patient will be independenet with HEP. Short Term Goal 2: Patient will increase PROM in bilateral wrists by 10 for increased abillity to complete daily activities. Short Term Goal 3: Patient will increase bilateral wrist and forearm strength to 4+/5 for increased ability to pick up her children. Short Term Goal 4: Patient will increase bilateral grip strength by 10 pounds and pinch by 2 pounds for increased ability to open containers. Short Term Goal 5: Patient will decrease pain to 3/10 in her wrists when completing functional activities. Long Term Goals Time to Complete Long Term Goals: Other (comment) (6 weeks) Long Term Goal 1: Patient will return to prior level of I with all B/IADL, work, and leisure activities. Long Term Goal 2:  Patient will increase bilateral wrist AROM to WNL for increased ability to care for her children. Long Term Goal 3: Patient will increase bilateral wrist strength to 5/5 for increased ability to pick up her foster children. Long Term Goal 4: Patient will decrease fascial restrictions to trace in her wrists. Long Term Goal 5: Patient will decrease pain to 1/10 in her wrists while working on the computer. Additional Long Term Goals?: Yes Long Term Goal 6: Patient will increase bilateral grip strength by 20 pounds and pinch strength by 4 pounds for increased ability to open containers. End of Session Patient Active Problem List  Diagnoses  . 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 MORBID OBESITY  . CELLULITIS, LEG, RIGHT  . HIDRADENITIS  . CLOSED FRACTURE OF BASE OF OTHER METACARPAL BONE  . Pain in wrist joint  . Muscle weakness (generalized)   End of Session Activity Tolerance: Patient tolerated treatment well General Behavior During Session: [facility] for tasks performed Cognition: [facility] for tasks performed   Antwoine Zorn L. Chloey Ricard, COTA/L  05/23/2011, 3:59 PM

## 2011-05-25 ENCOUNTER — Ambulatory Visit (HOSPITAL_COMMUNITY)
Admission: RE | Admit: 2011-05-25 | Discharge: 2011-05-25 | Payer: Self-pay | Source: Ambulatory Visit | Attending: *Deleted | Admitting: *Deleted

## 2011-05-25 DIAGNOSIS — M25539 Pain in unspecified wrist: Secondary | ICD-10-CM

## 2011-05-25 DIAGNOSIS — M6281 Muscle weakness (generalized): Secondary | ICD-10-CM

## 2011-05-25 NOTE — Progress Notes (Signed)
Occupational Therapy Treatment  Patient Details  Name: Stacy Ashley MRN: [id number] Date of Birth: 09-Jun-1946  Today's Date: 05/25/2011 Time: 4098-1191 Time Calculation (min): 41 min Manual Therapy [id number] 13' Therapeutic Exercises [phone] 61' Visit#: 4  of 12   Re-eval: 05/31/11    Subjective Symptoms/Limitations Symptoms: S:  The swelling is what gets me the most. Pain Assessment Currently in Pain?: Yes Pain Score:   7 Pain Location: Wrist Pain Orientation: Left;Right  Exercise/Treatments Wrist Exercises Forearm Supination: PROM;AROM;10 reps Forearm Pronation: PROM;AROM;10 reps Wrist Flexion: PROM;AROM;10 reps Wrist Extension: PROM;AROM;10 reps   Sponges: right 20, 21  left 31, 28 Theraputty: Flatten;Roll;Grip;Locate Pegs Theraputty - Flatten: pink Theraputty - Roll: pink Theraputty - Grip: pink Theraputty - Locate Pegs: pink Hand Gripper with Large Beads: 5 each hand right more difficult than left     Manual Therapy Manual Therapy: Myofascial release Myofascial Release: MFR and manual stretching to bilateral flexor and extensor forearms, wrists, and hands to increase P/AROM and decrease pain.  [id number]  Occupational Therapy Assessment and Plan OT Assessment and Plan Clinical Impression Statement: A:  Added hand gripper with large beads and increased to pink tputty. OT Plan: P:  Add 1# to forearm and wrist AROM.   Goals Short Term Goals Time to Complete Short Term Goals: 3 weeks Short Term Goal 1: Patient will be independenet with HEP. Short Term Goal 2: Patient will increase PROM in bilateral wrists by 10 for increased abillity to complete daily activities. Short Term Goal 3: Patient will increase bilateral wrist and forearm strength to 4+/5 for increased ability to pick up her children. Short Term Goal 4: Patient will increase bilateral grip strength by 10 pounds and pinch by 2 pounds for increased ability to open containers. Short Term Goal 5:  Patient will decrease pain to 3/10 in her wrists when completing functional activities. Long Term Goals Time to Complete Long Term Goals: Other (comment) (6 weeks) Long Term Goal 1: Patient will return to prior level of I with all B/IADL, work, and leisure activities. Long Term Goal 2: Patient will increase bilateral wrist AROM to WNL for increased ability to care for her children. Long Term Goal 3: Patient will increase bilateral wrist strength to 5/5 for increased ability to pick up her foster children. Long Term Goal 4: Patient will decrease fascial restrictions to trace in her wrists. Long Term Goal 5: Patient will decrease pain to 1/10 in her wrists while working on the computer. Additional Long Term Goals?: Yes Long Term Goal 6: Patient will increase bilateral grip strength by 20 pounds and pinch strength by 4 pounds for increased ability to open containers. End of Session Patient Active Problem List  Diagnoses  . 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 MORBID OBESITY  . CELLULITIS, LEG, RIGHT  . HIDRADENITIS  . CLOSED FRACTURE OF BASE OF OTHER METACARPAL BONE  . Pain in wrist joint  . Muscle weakness (generalized)   End of Session Activity Tolerance: Patient tolerated treatment well General Behavior During Session: [facility] for tasks performed Cognition: [facility] for tasks performed   Shirlean Mylar, OTR/L  05/25/2011, 1:55 PM

## 2011-05-30 ENCOUNTER — Telehealth (HOSPITAL_COMMUNITY): Payer: Self-pay

## 2011-05-30 ENCOUNTER — Ambulatory Visit (HOSPITAL_COMMUNITY): Payer: Self-pay | Admitting: Occupational Therapy

## 2011-06-01 ENCOUNTER — Ambulatory Visit (HOSPITAL_COMMUNITY): Payer: Self-pay | Admitting: Specialist

## 2011-06-06 ENCOUNTER — Ambulatory Visit (HOSPITAL_COMMUNITY): Payer: Self-pay | Admitting: Occupational Therapy

## 2011-06-08 ENCOUNTER — Telehealth (HOSPITAL_COMMUNITY): Payer: Self-pay

## 2011-06-08 ENCOUNTER — Ambulatory Visit (HOSPITAL_COMMUNITY): Payer: Self-pay | Admitting: Specialist

## 2011-06-27 ENCOUNTER — Ambulatory Visit (HOSPITAL_COMMUNITY)
Admission: RE | Admit: 2011-06-27 | Discharge: 2011-06-27 | Disposition: A | Discharge status: Home / Self Care | Payer: Self-pay | Source: Ambulatory Visit | Attending: Chiropractic Medicine | Admitting: Chiropractic Medicine

## 2011-06-27 DIAGNOSIS — M25629 Stiffness of unspecified elbow, not elsewhere classified: Secondary | ICD-10-CM | POA: Insufficient documentation

## 2011-06-27 DIAGNOSIS — IMO0001 Reserved for inherently not codable concepts without codable children: Secondary | ICD-10-CM | POA: Insufficient documentation

## 2011-06-27 DIAGNOSIS — M6281 Muscle weakness (generalized): Secondary | ICD-10-CM | POA: Insufficient documentation

## 2011-06-27 DIAGNOSIS — M25529 Pain in unspecified elbow: Secondary | ICD-10-CM | POA: Insufficient documentation

## 2011-06-27 DIAGNOSIS — M25539 Pain in unspecified wrist: Secondary | ICD-10-CM

## 2011-06-27 NOTE — Progress Notes (Signed)
Occupational Therapy Treatment  Patient Details  Name: Stacy Ashley MRN: [id number] Date of Birth: 1946/05/28  Today's Date: 06/27/2011 Time: 4098-1191 Re-evaluation [id number] 30' Therapeutic Exercise 1308-6578 13' Time Calculation (min): 43 min  Visit#: 5  of 12   Re-eval: 06/27/11    Subjective Symptoms/Limitations Symptoms: S: " My right hand is about an inch more swollen than my left and my pain is not too bad because I'm on pain meds. " i broke and dislocated my right shoulder 06/17/11  stepping off a curb." Repetition: Decreases Symptoms Pain Assessment Currently in Pain?: Yes Pain Score:   3 Pain Location: Wrist Pain Orientation: Right Pain Type: Acute pain Multiple Pain Sites: No  Precautions/Restrictions Patient's Left arm now fractured at shoulder and unable to utilize left arm. LUE in sling.    Exercise/TreatmentsRe-evaluation Wrist Exercises Forearm Supination: PROM Forearm Pronation: PROM Wrist Flexion: PROM Wrist Extension: PROM   Theraputty: Flatten;Roll;Grip;Locate Pegs Theraputty - Flatten: pink Theraputty - Roll: pink Theraputty - Grip: pink Theraputty - Locate Pegs: pink  Hand Exercises Theraputty: Flatten;Roll;Grip;Locate Pegs Theraputty - Flatten: pink Theraputty - Roll: pink Theraputty - Grip: pink Theraputty - Locate Pegs: pink        Occupational Therapy Assessment and Plan OT Assessment and Plan Clinical Impression Statement: A: Patient re-evaluated today for progress and has met all STG's and LTG's 1-4 of 5. Patient ready for discharge as she states she has regained functional use of the right hand. Rehab Potential: Excellent OT Frequency: Other (comment) (Discharge today) OT Duration: Other (comment) (discharge today) OT Treatment/Interventions: Therapeutic exercise;Patient/family education OT Plan: P: Full 5/5 strength in wrist with functional AROM of Right wrist. Discharge OT today.   Goals Short Term Goals Short Term Goal 1  Progress: Met Short Term Goal 2 Progress: Partly met Short Term Goal 3 Progress: Met Short Term Goal 4 Progress: Met Short Term Goal 5 Progress: Met Long Term Goals Long Term Goal 1 Progress: Met Long Term Goal 2 Progress: Partly met Long Term Goal 3 Progress: Met Long Term Goal 4 Progress: Met Long Term Goal 5 Progress: Progressing toward goal Long Term Goal 6 Progress: Partly met  Problem List Patient Active Problem List  Diagnoses  . 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 MORBID OBESITY  . CELLULITIS, LEG, RIGHT  . HIDRADENITIS  . CLOSED FRACTURE OF BASE OF OTHER METACARPAL BONE  . Pain in wrist joint  . Muscle weakness (generalized)    End of Session Activity Tolerance: Patient tolerated treatment well General Behavior During Session: [facility] for tasks performed Cognition: [facility] for tasks performed   Lisa Roca OTR/L 06/27/2011, 2:08 PM

## 2011-07-03 ENCOUNTER — Encounter: Payer: Self-pay | Admitting: Orthopedic Surgery

## 2011-07-03 ENCOUNTER — Ambulatory Visit (HOSPITAL_COMMUNITY)
Admission: RE | Admit: 2011-07-03 | Discharge: 2011-07-03 | Disposition: A | Discharge status: Home / Self Care | Payer: Self-pay | Source: Ambulatory Visit | Attending: Orthopedic Surgery | Admitting: Orthopedic Surgery

## 2011-07-03 ENCOUNTER — Ambulatory Visit (INDEPENDENT_AMBULATORY_CARE_PROVIDER_SITE_OTHER): Payer: Self-pay | Admitting: Orthopedic Surgery

## 2011-07-03 VITALS — Ht 68.0 in | Wt 253.0 lb

## 2011-07-03 DIAGNOSIS — S4290XA Fracture of unspecified shoulder girdle, part unspecified, initial encounter for closed fracture: Secondary | ICD-10-CM

## 2011-07-03 DIAGNOSIS — S42209A Unspecified fracture of upper end of unspecified humerus, initial encounter for closed fracture: Secondary | ICD-10-CM

## 2011-07-03 DIAGNOSIS — M25519 Pain in unspecified shoulder: Secondary | ICD-10-CM | POA: Insufficient documentation

## 2011-07-03 DIAGNOSIS — S42253A Displaced fracture of greater tuberosity of unspecified humerus, initial encounter for closed fracture: Secondary | ICD-10-CM | POA: Insufficient documentation

## 2011-07-03 DIAGNOSIS — W19XXXA Unspecified fall, initial encounter: Secondary | ICD-10-CM | POA: Insufficient documentation

## 2011-07-03 HISTORY — DX: Fracture of unspecified shoulder girdle, part unspecified, initial encounter for closed fracture: S42.90XA

## 2011-07-03 NOTE — Patient Instructions (Addendum)
MRI LEFT SHOULDER  DX FRACTURE DISLOCATION  ROTATOR CUFF TEAR  WEAR SLING

## 2011-07-03 NOTE — Progress Notes (Signed)
Patient ID: Stacy Ashley, female   DOB: 06/18/1946, 65 y.o.   MRN: [id number] Chief Complaint  Patient presents with  . Shoulder Injury    Left shoulder injury on 06-17-11.   complaint dislocated LEFT shoulder with fractured humeral head  The patient complains of pain in the LEFT shoulder and numbness in the fingers of the LEFT hand Started June 17, 2011 Started secondary to a fall at church called to [employer] to church  Location [place]  Symptoms dull throbbing burning pain LEFT shoulder and LEFT hand including numbness in the fingers Timing constant the numbness seems to come and go.  Things are better with rest and worse with movement there is some bruising in the LEFT upper arm.  Review of systems is negative except for musculoskeletal findings and the neurologic findings as stated she also has seasonal ALLERGIES   Ht 5' 8" (1.727 m)  Wt 253 lb (114.76 kg)  BMI 38.47 kg/m2  Physical Exam(12) GENERAL: normal development   CDV: pulses are normal   Skin: normal  Lymph: nodes were not palpable/normal  Psychiatric: awake, alert and oriented  Neuro: normal sensation  MSK The patient basically has no movement of the LEFT upper extremity at the shoulder joint. 1   The LEFT hand wrist and elbow have normal range of motion, on inspection there is no pain or tenderness.  Strength is normal.  Elbow wrist and hand are stable. 2  Neurologic exam reveals normal soft touch in the LEFT hand. 3 The cervical spine has normal range of motion with no increased muscle tension and stability or abnormal alignment 4 The patient's gait is normal.    Assessment: X-rays were on a disc they included one view on the AP after reduction.  The dislocation film was not available.  I repeated the lateral view and the shoulder is reduced.  There is a greater tuberosity fracture.  She most likely has a rotator cuff approval and injury.  She may need  repair the greater tuberosity.  May need repair the cuff.    Plan: MRI to evaluate rotator cuff and greater tuberosity fracture.  

## 2011-07-04 ENCOUNTER — Ambulatory Visit: Payer: Self-pay | Admitting: Orthopedic Surgery

## 2011-07-04 ENCOUNTER — Telehealth: Payer: Self-pay | Admitting: Orthopedic Surgery

## 2011-07-04 NOTE — Telephone Encounter (Signed)
Stacy Ashley called  this morning asking for results of her MRI. Her # [phone]

## 2011-07-06 ENCOUNTER — Telehealth: Payer: Self-pay | Admitting: Orthopedic Surgery

## 2011-07-06 NOTE — Telephone Encounter (Signed)
Patient called today to ask about her surgery date. Relayed that Dr. Romeo Apple was working on scheduling it with the hospital.

## 2011-07-10 ENCOUNTER — Other Ambulatory Visit: Payer: Self-pay | Admitting: *Deleted

## 2011-07-10 NOTE — Telephone Encounter (Signed)
Surgery is scheduled for Feb 1 @ 7:30am Pre op Jan 28 @11am  Called patient, left voicemail

## 2011-07-11 NOTE — Telephone Encounter (Signed)
Completed as per nurse's note.

## 2011-07-11 NOTE — Telephone Encounter (Signed)
Patient aware of surgery and pre op date

## 2011-07-11 NOTE — Telephone Encounter (Signed)
Patient has mentioned that she would be glad to have surgery at an earlier date if there are any cancellations.

## 2011-07-13 ENCOUNTER — Telehealth: Payer: Self-pay | Admitting: Orthopedic Surgery

## 2011-07-13 ENCOUNTER — Encounter: Payer: Self-pay | Admitting: Orthopedic Surgery

## 2011-07-13 NOTE — Telephone Encounter (Signed)
Patient is asking what exactly is the procedure that Dr. Romeo Apple is performing 07/21/11? She said she didn't ask enough questions, and wanted to have some understanding of what she is having done.  Is there any literature available she can have to review? Her phone # is [phone].

## 2011-07-14 ENCOUNTER — Encounter (HOSPITAL_COMMUNITY): Payer: Self-pay | Admitting: Pharmacy Technician

## 2011-07-17 ENCOUNTER — Other Ambulatory Visit: Payer: Self-pay

## 2011-07-17 ENCOUNTER — Encounter (HOSPITAL_COMMUNITY)
Admission: RE | Admit: 2011-07-17 | Discharge: 2011-07-17 | Disposition: A | Discharge status: Home / Self Care | Payer: Self-pay | Source: Ambulatory Visit | Attending: Orthopedic Surgery | Admitting: Orthopedic Surgery

## 2011-07-17 ENCOUNTER — Encounter (HOSPITAL_COMMUNITY): Payer: Self-pay

## 2011-07-17 HISTORY — DX: Adverse effect of unspecified anesthetic, initial encounter: T41.45XA

## 2011-07-17 HISTORY — DX: Acute myocardial infarction, unspecified: I21.9

## 2011-07-17 LAB — BASIC METABOLIC PANEL
BUN: 16 mg/dL (ref 6–23)
CO2: 27 mEq/L (ref 19–32)
Calcium: 9 mg/dL (ref 8.4–10.5)
Chloride: 104 mEq/L (ref 96–112)
Creatinine, Ser: 0.83 mg/dL (ref 0.50–1.10)

## 2011-07-17 LAB — HEMOGLOBIN AND HEMATOCRIT, BLOOD
HCT: 37.1 % (ref 36.0–46.0)
Hemoglobin: 12 g/dL (ref 12.0–15.0)

## 2011-07-17 LAB — SURGICAL PCR SCREEN: MRSA, PCR: NEGATIVE

## 2011-07-17 NOTE — Telephone Encounter (Signed)
OTIF SHOULDER  12 WEEKS NO LIFTING   GR TUB FRACTURE

## 2011-07-17 NOTE — Patient Instructions (Addendum)
[name]  07/17/2011   Your procedure is scheduled on:   07/21/2011  Report to [facility] at 615 * AM.  Call this number if you have problems the morning of surgery: [phone]   Remember:   Do not eat food:After Midnight.  May have clear liquids:until Midnight .  Clear liquids include soda, tea, black coffee, apple or grape juice, broth.  Take these medicines the morning of surgery with A SIP OF WATER: tramadol   Do not wear jewelry, make-up or nail polish.  Do not wear lotions, powders, or perfumes. You may wear deodorant.  Do not shave 48 hours prior to surgery.  Do not bring valuables to the hospital.  Contacts, dentures or bridgework may not be worn into surgery.  Leave suitcase in the car. After surgery it may be brought to your room.  For patients admitted to the hospital, checkout time is 11:00 AM the day of discharge.   Patients discharged the day of surgery will not be allowed to drive home.  Name and phone number of your driver: family  Special Instructions: CHG Shower Use Special Wash: 1/2 bottle night before surgery and 1/2 bottle morning of surgery.   Please read over the following fact sheets that you were given: Pain Booklet, MRSA Information, Surgical Site Infection Prevention, Anesthesia Post-op Instructions and Care and Recovery After Surgery PATIENT INSTRUCTIONS POST-ANESTHESIA  IMMEDIATELY FOLLOWING SURGERY:  Do not drive or operate machinery for the first twenty four hours after surgery.  Do not make any important decisions for twenty four hours after surgery or while taking narcotic pain medications or sedatives.  If you develop intractable nausea and vomiting or a severe headache please notify your doctor immediately.  FOLLOW-UP:  Please make an appointment with your surgeon as instructed. You do not need to follow up with anesthesia unless specifically instructed to do so.  WOUND CARE INSTRUCTIONS (if applicable):  Keep a dry clean dressing on the  anesthesia/puncture wound site if there is drainage.  Once the wound has quit draining you may leave it open to air.  Generally you should leave the bandage intact for twenty four hours unless there is drainage.  If the epidural site drains for more than 36-48 hours please call the anesthesia department.  QUESTIONS?:  Please feel free to call your physician or the hospital operator if you have any questions, and they will be happy to assist you.     [facility] Anesthesia Department [address] [place] [phone]

## 2011-07-17 NOTE — Telephone Encounter (Signed)
07/17/11 Patient called back re: (1) states had to re-schedule her pre-op appointment to today (07/17/11) 2:00pm                                                   (2) states (per previous note) that she really needs to speak w/Dr. Romeo Apple, as she is                                                        having a lot of anxiety about her upcoming procedure; states has never had surgery                                                        and she has questions. She wishes Dr.Harrison to call her would be glad to come in if                                                         needed. Her ph# is [phone].

## 2011-07-18 ENCOUNTER — Ambulatory Visit: Payer: Self-pay | Admitting: Orthopedic Surgery

## 2011-07-20 NOTE — Consult Note (Addendum)
   Chief Complaint   Patient presents with   .  Shoulder Injury     Left shoulder injury on 06-17-11.   complaint dislocated LEFT shoulder with fractured humeral head  The patient complains of pain in the LEFT shoulder and numbness in the fingers of the LEFT hand  Started June 17, 2011  Started secondary to a fall at church called to [employer] day advantageous to church  Location [place]  Symptoms dull throbbing burning pain LEFT shoulder and LEFT hand including numbness in the fingers  Timing constant the numbness seems to come and go. Things are better with rest and worse with movement there is some bruising in the LEFT upper arm.  Review of systems is negative except for musculoskeletal findings and the neurologic findings as stated she also has seasonal ALLERGIES   Past Medical History  Diagnosis Date  . Carpal tunnel syndrome   . Complication of anesthesia 1985    pt sts bp dropped "really low" and they had trouble bringing me back  . Myocardial infarction 08-2009    Past Surgical History  Procedure Date  . Cesarean section     x3  . Lymphectomy right cervical  . Tubal ligation   . Lipoma excision 12/2007    bilateral ankles  . Septoplasty 04/2010    History   Social History  . Marital Status: Divorced    Spouse Name: N/A    Number of Children: N/A  . Years of Education: N/A   Occupational History  . Not on file.   Social History Main Topics  . Smoking status: Never Smoker   . Smokeless tobacco: Not on file  . Alcohol Use: No  . Drug Use: No  . Sexually Active: Yes    Birth Control/ Protection: Post-menopausal   Other Topics Concern  . Not on file   Social History Narrative  . No narrative on file     Ht 5' 8" (1.727 m)  Wt 253 lb (114.76 kg)  BMI 38.47 kg/m2  Physical Exam(12)  GENERAL: normal development  CDV: pulses are normal  Skin: normal  Lymph: nodes were not palpable/normal  Psychiatric: awake, alert and  oriented  Neuro: normal sensation  MSK The patient basically has no movement of the LEFT upper extremity at the shoulder joint.  1 The LEFT hand wrist and elbow have normal range of motion, on inspection there is no pain or tenderness. Strength is normal. Elbow wrist and hand are stable.  2 Neurologic exam reveals normal soft touch in the LEFT hand.  3 The cervical spine has normal range of motion with no increased muscle tension and stability or abnormal alignment  4 The patient's gait is normal.  Assessment: X-rays were on a disc they included one view on the AP after reduction. The dislocation film was not available. I repeated the lateral view and the shoulder is reduced. There is a greater tuberosity fracture. She most likely has a rotator cuff injury.  She may need repair the greater tuberosity. May need repair the cuff. PLAN: Open repair greater tuberosity fracture, plus or minus rotator cuff repair , LEFT shoulder  

## 2011-07-21 ENCOUNTER — Encounter (HOSPITAL_COMMUNITY): Payer: Self-pay | Admitting: *Deleted

## 2011-07-21 ENCOUNTER — Encounter (HOSPITAL_COMMUNITY): Payer: Self-pay | Admitting: Anesthesiology

## 2011-07-21 ENCOUNTER — Ambulatory Visit (HOSPITAL_COMMUNITY): Payer: Self-pay

## 2011-07-21 ENCOUNTER — Ambulatory Visit (HOSPITAL_COMMUNITY)
Admission: RE | Admit: 2011-07-21 | Discharge: 2011-07-21 | Disposition: A | Discharge status: Home / Self Care | Payer: Self-pay | Source: Ambulatory Visit | Attending: Orthopedic Surgery | Admitting: Orthopedic Surgery

## 2011-07-21 ENCOUNTER — Encounter (HOSPITAL_COMMUNITY)
Admission: RE | Disposition: A | Discharge status: Home / Self Care | Payer: Self-pay | Source: Ambulatory Visit | Attending: Orthopedic Surgery

## 2011-07-21 ENCOUNTER — Ambulatory Visit (HOSPITAL_COMMUNITY): Payer: Self-pay | Admitting: Anesthesiology

## 2011-07-21 DIAGNOSIS — S42253A Displaced fracture of greater tuberosity of unspecified humerus, initial encounter for closed fracture: Secondary | ICD-10-CM

## 2011-07-21 DIAGNOSIS — W19XXXA Unspecified fall, initial encounter: Secondary | ICD-10-CM | POA: Insufficient documentation

## 2011-07-21 DIAGNOSIS — Z01812 Encounter for preprocedural laboratory examination: Secondary | ICD-10-CM | POA: Insufficient documentation

## 2011-07-21 DIAGNOSIS — Z01810 Encounter for preprocedural cardiovascular examination: Secondary | ICD-10-CM | POA: Insufficient documentation

## 2011-07-21 HISTORY — PX: SHOULDER OPEN ROTATOR CUFF REPAIR: SHX2407

## 2011-07-21 HISTORY — PX: ORIF SHOULDER FRACTURE: SHX5035

## 2011-07-21 SURGERY — OPEN REDUCTION INTERNAL FIXATION (ORIF) SHOULDER FRACTURE
Anesthesia: General | Site: Shoulder | Laterality: Left | Wound class: Clean

## 2011-07-21 MED ORDER — BUPIVACAINE-EPINEPHRINE PF 0.5-1:200000 % IJ SOLN
INTRAMUSCULAR | Status: AC
  Filled 2011-07-21: qty 20

## 2011-07-21 MED ORDER — SUFENTANIL CITRATE 50 MCG/ML IV SOLN
INTRAVENOUS | Status: DC | PRN
  Administered 2011-07-21 (×10): 10 ug via INTRAVENOUS

## 2011-07-21 MED ORDER — CYCLOBENZAPRINE HCL 5 MG PO TABS: 5.0000 mg | ORAL_TABLET | Freq: Three times a day (TID) | ORAL | Status: AC | PRN

## 2011-07-21 MED ORDER — OXYCODONE-ACETAMINOPHEN 5-325 MG PO TABS: 1.0000 | ORAL_TABLET | ORAL | Status: DC | PRN

## 2011-07-21 MED ORDER — KETOROLAC TROMETHAMINE 30 MG/ML IJ SOLN
30.0000 mg | Freq: Once | INTRAMUSCULAR | Status: AC
  Administered 2011-07-21: 30 mg via INTRAVENOUS

## 2011-07-21 MED ORDER — LIDOCAINE HCL (PF) 1 % IJ SOLN
INTRAMUSCULAR | Status: AC
  Filled 2011-07-21: qty 5

## 2011-07-21 MED ORDER — HYDROMORPHONE HCL PF 1 MG/ML IJ SOLN
INTRAMUSCULAR | Status: AC
  Administered 2011-07-21: 0.25 mg via INTRAVENOUS
  Filled 2011-07-21: qty 1

## 2011-07-21 MED ORDER — ONDANSETRON HCL 4 MG/2ML IJ SOLN
INTRAMUSCULAR | Status: AC
  Administered 2011-07-21: 4 mg via INTRAVENOUS
  Filled 2011-07-21: qty 2

## 2011-07-21 MED ORDER — HYDROMORPHONE HCL PF 1 MG/ML IJ SOLN
0.2500 mg | INTRAMUSCULAR | Status: DC | PRN
  Administered 2011-07-21: 0.5 mg via INTRAVENOUS
  Administered 2011-07-21 (×2): 0.25 mg via INTRAVENOUS

## 2011-07-21 MED ORDER — NEOSTIGMINE METHYLSULFATE 1 MG/ML IJ SOLN
INTRAMUSCULAR | Status: DC | PRN
  Administered 2011-07-21: 4 mg via INTRAVENOUS

## 2011-07-21 MED ORDER — BUPIVACAINE-EPINEPHRINE PF 0.5-1:200000 % IJ SOLN
INTRAMUSCULAR | Status: DC | PRN
  Administered 2011-07-21: 60 mL

## 2011-07-21 MED ORDER — ROCURONIUM BROMIDE 50 MG/5ML IV SOLN
INTRAVENOUS | Status: AC
  Filled 2011-07-21: qty 1

## 2011-07-21 MED ORDER — IBUPROFEN 800 MG PO TABS: 800.0000 mg | ORAL_TABLET | Freq: Three times a day (TID) | ORAL | Status: DC | PRN

## 2011-07-21 MED ORDER — CELECOXIB 100 MG PO CAPS
ORAL_CAPSULE | ORAL | Status: AC
  Administered 2011-07-21: 400 mg via ORAL
  Filled 2011-07-21: qty 4

## 2011-07-21 MED ORDER — ONDANSETRON HCL 4 MG/2ML IJ SOLN
4.0000 mg | Freq: Once | INTRAMUSCULAR | Status: AC
  Administered 2011-07-21: 4 mg via INTRAVENOUS

## 2011-07-21 MED ORDER — LIDOCAINE HCL (CARDIAC) 10 MG/ML IV SOLN
INTRAVENOUS | Status: DC | PRN
  Administered 2011-07-21: 50 mg via INTRAVENOUS

## 2011-07-21 MED ORDER — SUFENTANIL CITRATE 50 MCG/ML IV SOLN
INTRAVENOUS | Status: AC
  Filled 2011-07-21: qty 1

## 2011-07-21 MED ORDER — ONDANSETRON HCL 4 MG/2ML IJ SOLN
4.0000 mg | Freq: Once | INTRAMUSCULAR | Status: AC | PRN
  Administered 2011-07-21: 4 mg via INTRAVENOUS

## 2011-07-21 MED ORDER — METHOCARBAMOL 100 MG/ML IJ SOLN
500.0000 mg | Freq: Once | INTRAVENOUS | Status: AC
  Administered 2011-07-21: 500 mg via INTRAVENOUS
  Filled 2011-07-21 (×3): qty 5

## 2011-07-21 MED ORDER — GLYCOPYRROLATE 0.2 MG/ML IJ SOLN
INTRAMUSCULAR | Status: AC
  Filled 2011-07-21: qty 2

## 2011-07-21 MED ORDER — GLYCOPYRROLATE 0.2 MG/ML IJ SOLN
0.2000 mg | Freq: Once | INTRAMUSCULAR | Status: AC
  Administered 2011-07-21: 0.2 mg via INTRAVENOUS

## 2011-07-21 MED ORDER — GLYCOPYRROLATE 0.2 MG/ML IJ SOLN
INTRAMUSCULAR | Status: AC
  Administered 2011-07-21: 0.2 mg via INTRAVENOUS
  Filled 2011-07-21: qty 1

## 2011-07-21 MED ORDER — CELECOXIB 100 MG PO CAPS
400.0000 mg | ORAL_CAPSULE | Freq: Once | ORAL | Status: AC
  Administered 2011-07-21: 400 mg via ORAL

## 2011-07-21 MED ORDER — CEFAZOLIN SODIUM 1 G IJ SOLR
INTRAMUSCULAR | Status: AC
  Filled 2011-07-21: qty 10

## 2011-07-21 MED ORDER — CEFAZOLIN SODIUM 1-5 GM-% IV SOLN
INTRAVENOUS | Status: AC
  Filled 2011-07-21: qty 50

## 2011-07-21 MED ORDER — LACTATED RINGERS IV SOLN
INTRAVENOUS | Status: DC
  Administered 2011-07-21: 1000 mL via INTRAVENOUS

## 2011-07-21 MED ORDER — EPHEDRINE SULFATE 50 MG/ML IJ SOLN
INTRAMUSCULAR | Status: AC
  Filled 2011-07-21: qty 1

## 2011-07-21 MED ORDER — CEFAZOLIN SODIUM 1-5 GM-% IV SOLN
INTRAVENOUS | Status: DC | PRN
  Administered 2011-07-21: 2 g via INTRAVENOUS

## 2011-07-21 MED ORDER — PROPOFOL 10 MG/ML IV EMUL
INTRAVENOUS | Status: AC
  Filled 2011-07-21: qty 20

## 2011-07-21 MED ORDER — LACTATED RINGERS IV SOLN
INTRAVENOUS | Status: DC | PRN
  Administered 2011-07-21 (×2): via INTRAVENOUS

## 2011-07-21 MED ORDER — CEFAZOLIN SODIUM-DEXTROSE 2-3 GM-% IV SOLR: 2.0000 g | INTRAVENOUS | Status: DC

## 2011-07-21 MED ORDER — FENTANYL CITRATE 0.05 MG/ML IJ SOLN
INTRAMUSCULAR | Status: AC
Start: 2011-07-21 — End: 2011-07-21
  Administered 2011-07-21: 50 ug via INTRAVENOUS
  Filled 2011-07-21: qty 2

## 2011-07-21 MED ORDER — KETOROLAC TROMETHAMINE 30 MG/ML IJ SOLN
INTRAMUSCULAR | Status: AC
  Administered 2011-07-21: 30 mg via INTRAVENOUS
  Filled 2011-07-21: qty 1

## 2011-07-21 MED ORDER — CHLORHEXIDINE GLUCONATE 4 % EX LIQD
60.0000 mL | Freq: Once | CUTANEOUS | Status: DC
  Filled 2011-07-21: qty 60

## 2011-07-21 MED ORDER — NEOSTIGMINE METHYLSULFATE 1 MG/ML IJ SOLN
INTRAMUSCULAR | Status: AC
  Filled 2011-07-21: qty 10

## 2011-07-21 MED ORDER — ONDANSETRON HCL 4 MG/2ML IJ SOLN: 4.0000 mg | Freq: Once | INTRAMUSCULAR | Status: DC | PRN

## 2011-07-21 MED ORDER — EPHEDRINE SULFATE 50 MG/ML IJ SOLN
INTRAMUSCULAR | Status: DC | PRN
  Administered 2011-07-21 (×2): 5 mg via INTRAVENOUS

## 2011-07-21 MED ORDER — SODIUM CHLORIDE 0.9 % IR SOLN
Status: DC | PRN
  Administered 2011-07-21: 1000 mL

## 2011-07-21 MED ORDER — GLYCOPYRROLATE 0.2 MG/ML IJ SOLN
INTRAMUSCULAR | Status: DC | PRN
  Administered 2011-07-21: .6 mg via INTRAVENOUS

## 2011-07-21 MED ORDER — MIDAZOLAM HCL 2 MG/2ML IJ SOLN
1.0000 mg | INTRAMUSCULAR | Status: DC | PRN
Start: 2011-07-21 — End: 2011-07-21
  Administered 2011-07-21: 2 mg via INTRAVENOUS

## 2011-07-21 MED ORDER — PROPOFOL 10 MG/ML IV EMUL
INTRAVENOUS | Status: DC | PRN
  Administered 2011-07-21: 150 mg via INTRAVENOUS

## 2011-07-21 MED ORDER — FENTANYL CITRATE 0.05 MG/ML IJ SOLN
25.0000 ug | INTRAMUSCULAR | Status: DC | PRN
  Administered 2011-07-21 (×2): 50 ug via INTRAVENOUS

## 2011-07-21 MED ORDER — OXYCODONE-ACETAMINOPHEN 5-325 MG PO TABS
ORAL_TABLET | ORAL | Status: AC
  Filled 2011-07-21: qty 1

## 2011-07-21 MED ORDER — ROCURONIUM BROMIDE 100 MG/10ML IV SOLN
INTRAVENOUS | Status: DC | PRN
  Administered 2011-07-21: 40 mg via INTRAVENOUS

## 2011-07-21 MED ORDER — MIDAZOLAM HCL 2 MG/2ML IJ SOLN
INTRAMUSCULAR | Status: AC
  Administered 2011-07-21: 2 mg via INTRAVENOUS
  Filled 2011-07-21: qty 2

## 2011-07-21 MED ORDER — ACETAMINOPHEN 325 MG PO TABS: 325.0000 mg | ORAL_TABLET | ORAL | Status: DC | PRN

## 2011-07-21 SURGICAL SUPPLY — 74 items
BIT DRILL 2.0MX128MM (BIT) IMPLANT
BIT DRILL 2.4X128 (BIT) ×1 IMPLANT
BIT DRILL 2.5X110 QC LCP DISP (BIT) ×1 IMPLANT
BLADE HEX COATED 2.75 (ELECTRODE) ×2 IMPLANT
BLADE OSC/SAGITTAL MD 9X18.5 (BLADE) ×2 IMPLANT
BLADE SURG 15 STRL LF DISP TIS (BLADE) ×1 IMPLANT
BLADE SURG 15 STRL SS (BLADE) ×2
BLADE SURG SZ10 CARB STEEL (BLADE) ×2 IMPLANT
BNDG COHESIVE 4X5 TAN NS LF (GAUZE/BANDAGES/DRESSINGS) ×2 IMPLANT
BUR FAST CUTTING (BURR)
BUR ROUND 5.0 (BURR) IMPLANT
BUR SRG 54X4.7X12 FLUT (BURR) IMPLANT
BURR SRG 54X4.7X12 FLUT (BURR)
CHLORAPREP W/TINT 10.5 ML (MISCELLANEOUS) ×1 IMPLANT
CHLORAPREP W/TINT 26ML (MISCELLANEOUS) ×2 IMPLANT
CLOTH BEACON ORANGE TIMEOUT ST (SAFETY) ×2 IMPLANT
COVER LIGHT HANDLE STERIS (MISCELLANEOUS) ×4 IMPLANT
COVER MAYO STAND XLG (DRAPE) ×2 IMPLANT
COVER PROBE W GEL 5X96 (DRAPES) ×2 IMPLANT
COVER X RAY CASSETTE (MISCELLANEOUS) ×2 IMPLANT
DECANTER SPIKE VIAL GLASS SM (MISCELLANEOUS) ×2 IMPLANT
DRAPE ORTHO 2.5IN SPLIT 77X108 (DRAPES) ×2 IMPLANT
DRAPE ORTHO SPLIT 77X108 STRL (DRAPES) ×4
DRAPE PROXIMA HALF (DRAPES) ×2 IMPLANT
DRAPE U-SHAPE 47X51 STRL (DRAPES) ×2 IMPLANT
DRESSING ALLEVYN BORDER 5X5 (GAUZE/BANDAGES/DRESSINGS) ×1 IMPLANT
ELECT REM PT RETURN 9FT ADLT (ELECTROSURGICAL) ×2
ELECTRODE REM PT RTRN 9FT ADLT (ELECTROSURGICAL) ×1 IMPLANT
FORMALIN 10 PREFIL 120ML (MISCELLANEOUS) ×1 IMPLANT
GAUZE SPONGE 4X4 16PLY XRAY LF (GAUZE/BANDAGES/DRESSINGS) ×1 IMPLANT
GLOVE ECLIPSE 6.5 STRL STRAW (GLOVE) ×2 IMPLANT
GLOVE EXAM NITRILE MD LF STRL (GLOVE) ×1 IMPLANT
GLOVE INDICATOR 7.0 STRL GRN (GLOVE) ×2 IMPLANT
GLOVE SKINSENSE NS SZ8.0 LF (GLOVE) ×1
GLOVE SKINSENSE STRL SZ8.0 LF (GLOVE) IMPLANT
GLOVE SS N UNI LF 8.5 STRL (GLOVE) ×2 IMPLANT
GOWN STRL REIN XL XLG (GOWN DISPOSABLE) ×6 IMPLANT
INST SET MINOR BONE (KITS) ×2 IMPLANT
KIT ROOM TURNOVER APOR (KITS) ×2 IMPLANT
MANIFOLD NEPTUNE II (INSTRUMENTS) ×2 IMPLANT
MARKER SKIN DUAL TIP RULER LAB (MISCELLANEOUS) ×3 IMPLANT
NDL HYPO 21X1.5 SAFETY (NEEDLE) ×1 IMPLANT
NDL MA TROC 1/2 (NEEDLE) IMPLANT
NDL MAYO 6 CRC TAPER PT (NEEDLE) IMPLANT
NEEDLE HYPO 21X1.5 SAFETY (NEEDLE) ×2 IMPLANT
NEEDLE MA TROC 1/2 (NEEDLE) ×2 IMPLANT
NEEDLE MAYO 6 CRC TAPER PT (NEEDLE) IMPLANT
NS IRRIG 1000ML POUR BTL (IV SOLUTION) ×2 IMPLANT
PACK BASIC III (CUSTOM PROCEDURE TRAY) ×2
PACK SRG BSC III STRL LF ECLPS (CUSTOM PROCEDURE TRAY) ×1 IMPLANT
PAD ARMBOARD 7.5X6 YLW CONV (MISCELLANEOUS) ×2 IMPLANT
PASSER SUT CAPTURE FIRST (SUTURE) IMPLANT
PENCIL HANDSWITCHING (ELECTRODE) ×2 IMPLANT
RASP SM TEAR CROSS CUT (RASP) ×1 IMPLANT
SCREW CANC PT 4.0X40 (Screw) ×2 IMPLANT
SCREW CANC PT 40X14X4X6X (Screw) IMPLANT
SET BASIN LINEN APH (SET/KITS/TRAYS/PACK) ×2 IMPLANT
SLING ARM FOAM STRAP LRG (SOFTGOODS) ×1 IMPLANT
SLING ARM FOAM STRAP MED (SOFTGOODS) ×1 IMPLANT
SLING ARM FOAM STRAP XLG (SOFTGOODS) ×1 IMPLANT
SPONGE LAP 18X18 X RAY DECT (DISPOSABLE) ×2 IMPLANT
STAPLER VISISTAT 35W (STAPLE) IMPLANT
STOCKINETTE IMPERVIOUS LG (DRAPES) ×2 IMPLANT
STRIP CLOSURE SKIN 1/2X4 (GAUZE/BANDAGES/DRESSINGS) ×1 IMPLANT
SUT BONE WAX W31G (SUTURE) IMPLANT
SUT ETHIBOND NAB OS 4 #2 30IN (SUTURE) ×5 IMPLANT
SUT ETHILON 3 0 FSL (SUTURE) IMPLANT
SUT MON AB 0 CT1 (SUTURE) ×2 IMPLANT
SUT MON AB 2-0 CT1 36 (SUTURE) ×3 IMPLANT
SUT PROLENE 3 0 PS 1 (SUTURE) IMPLANT
SYR 30ML LL (SYRINGE) ×2 IMPLANT
SYR BULB IRRIGATION 50ML (SYRINGE) ×4 IMPLANT
WASHER 7MM DIA (Washer) ×1 IMPLANT
YANKAUER SUCT 12FT TUBE ARGYLE (SUCTIONS) ×2 IMPLANT

## 2011-07-21 NOTE — Preoperative (Signed)
Beta Blockers   Reason not to administer Beta Blockers:Not Applicable 

## 2011-07-21 NOTE — Brief Op Note (Addendum)
07/21/2011  9:23 AM  PATIENT:  Stacy Ashley  66 y.o. female  PRE-OPERATIVE DIAGNOSIS:  left greater tuberosity fracture  POST-OPERATIVE DIAGNOSIS:  left greater tuberosity fracture  PROCEDURE:  Procedure(s): OPEN REDUCTION INTERNAL FIXATION (ORIF) SHOULDER FRACTURE ROTATOR CUFF REPAIR SHOULDER OPEN  Operative findings unstable nondisplaced greater tuberosity fragment. Rotator interval was intact. Supraspinatus tendon was attached to the greater tuberosity fragment.  SURGEON:  Surgeon(s): Fuller Canada, MD  PHYSICIAN ASSISTANT:   ASSISTANTS: DEBBIE DALLAS   ANESTHESIA:   general  EBL: 25   Total I/O In: 1000 [I.V.:1000] Out: 50 [Blood:50]  BLOOD ADMINISTERED:none  DRAINS: none   LOCAL MEDICATIONS USED:  MARCAINE WITH EPI 60 CC  SPECIMEN:  No Specimen  DISPOSITION OF SPECIMEN:  N/A  COUNTS:  YES  TOURNIQUET:  * No tourniquets in log *  DICTATION: .Dragon Dictation  PLAN OF CARE: Discharge to home after PACU  PATIENT DISPOSITION:  PACU - hemodynamically stable.   Delay start of Pharmacological VTE agent (>24hrs) due to surgical blood loss or risk of bleeding:  {YES/NO/NOT APPLICABLE:20182

## 2011-07-21 NOTE — Transfer of Care (Signed)
Immediate Anesthesia Transfer of Care Note  Patient: Stacy Ashley  Procedure(s) Performed:  OPEN REDUCTION INTERNAL FIXATION (ORIF) SHOULDER FRACTURE - greater tuberosity  Patient Location: PACU  Anesthesia Type: General  Level of Consciousness: awake, alert , oriented and patient cooperative  Airway & Oxygen Therapy: Patient Spontanous Breathing and Patient connected to face mask oxygen  Post-op Assessment: Report given to PACU RN and Post -op Vital signs reviewed and stable  Post vital signs: Reviewed and stable  Complications: No apparent anesthesia complications

## 2011-07-21 NOTE — H&P (View-Only) (Signed)
Patient ID: Stacy Ashley, female   DOB: [birth year], 66 y.o.   MRN: [id number] Chief Complaint  Patient presents with  . Shoulder Injury    Left shoulder injury on 06-17-11.   complaint dislocated LEFT shoulder with fractured humeral head  The patient complains of pain in the LEFT shoulder and numbness in the fingers of the LEFT hand Started June 17, 2011 Started secondary to a fall at church called to [employer] seventh day advantageous to church  Location [facility]  Symptoms dull throbbing burning pain LEFT shoulder and LEFT hand including numbness in the fingers Timing constant the numbness seems to come and go.  Things are better with rest and worse with movement there is some bruising in the LEFT upper arm.  Review of systems is negative except for musculoskeletal findings and the neurologic findings as stated she also has seasonal ALLERGIES   Ht 5\' 8"  (1.727 m)  Wt 253 lb (114.76 kg)  BMI 38.47 kg/m2  Physical Exam(12) GENERAL: normal development   CDV: pulses are normal   Skin: normal  Lymph: nodes were not palpable/normal  Psychiatric: awake, alert and oriented  Neuro: normal sensation  MSK The patient basically has no movement of the LEFT upper extremity at the shoulder joint. 1   The LEFT hand wrist and elbow have normal range of motion, on inspection there is no pain or tenderness.  Strength is normal.  Elbow wrist and hand are stable. 2  Neurologic exam reveals normal soft touch in the LEFT hand. 3 The cervical spine has normal range of motion with no increased muscle tension and stability or abnormal alignment 4 The patient's gait is normal.    Assessment: X-rays were on a disc they included one view on the AP after reduction.  The dislocation film was not available.  I repeated the lateral view and the shoulder is reduced.  There is a greater tuberosity fracture.  She most likely has a rotator cuff approval and injury.  She may need  repair the greater tuberosity.  May need repair the cuff.    Plan: MRI to evaluate rotator cuff and greater tuberosity fracture.

## 2011-07-21 NOTE — Anesthesia Preprocedure Evaluation (Signed)
Anesthesia Evaluation  Patient identified by MRN, date of birth, ID band  Reviewed: Allergy & Precautions, H&P , NPO status , Patient's Chart, lab work & pertinent test results  Airway Mallampati: I TM Distance: >3 FB Neck ROM: Full    Dental  (+) Upper Dentures   Pulmonary    Pulmonary exam normal       Cardiovascular + Past MI Regular Normal    Neuro/Psych  Neuromuscular disease    GI/Hepatic negative GI ROS, Neg liver ROS,   Endo/Other  Morbid obesity  Renal/GU negative Renal ROS     Musculoskeletal  (+) Arthritis -, Osteoarthritis,    Abdominal Normal abdominal exam  (+)  Abdomen: soft.    Peds  Hematology negative hematology ROS (+)   Anesthesia Other Findings   Reproductive/Obstetrics negative OB ROS                           Anesthesia Physical Anesthesia Plan  ASA: II  Anesthesia Plan: General   Post-op Pain Management:    Induction: Intravenous  Airway Management Planned: Oral ETT  Additional Equipment:   Intra-op Plan:   Post-operative Plan: Extubation in OR  Informed Consent: I have reviewed the patients History and Physical, chart, labs and discussed the procedure including the risks, benefits and alternatives for the proposed anesthesia with the patient or authorized representative who has indicated his/her understanding and acceptance.     Plan Discussed with: CRNA  Anesthesia Plan Comments:         Anesthesia Quick Evaluation

## 2011-07-21 NOTE — Anesthesia Procedure Notes (Signed)
Procedure Name: Intubation Date/Time: 07/21/2011 7:44 AM Performed by: Carolyne Littles, AMY Pre-anesthesia Checklist: Patient identified, Timeout performed, Emergency Drugs available, Suction available and Patient being monitored Patient Re-evaluated:Patient Re-evaluated prior to inductionOxygen Delivery Method: Circle System Utilized Preoxygenation: Pre-oxygenation with 100% oxygen Intubation Type: IV induction Ventilation: Mask ventilation without difficulty Laryngoscope Size: Miller and 3 Grade View: Grade I Tube type: Oral Tube size: 7.0 mm Number of attempts: 1 Placement Confirmation: ETT inserted through vocal cords under direct vision,  positive ETCO2 and breath sounds checked- equal and bilateral Secured at: 22 cm Tube secured with: Tape Dental Injury: Teeth and Oropharynx as per pre-operative assessment

## 2011-07-21 NOTE — Interval H&P Note (Signed)
History and Physical Interval Note:  07/21/2011 7:26 AM  Stacy Ashley  has presented today for surgery, with the diagnosis of left greater tuberosity fracture  The various methods of treatment have been discussed with the patient and family. After consideration of risks, benefits and other options for treatment, the patient has consented to  Procedure(s): OPEN REDUCTION INTERNAL FIXATION (ORIF) LEFT SHOULDER FRACTURE ROTATOR CUFF REPAIR SHOULDER OPEN as a surgical intervention .  The patients' history has been reviewed, patient examined, no change in status, stable for surgery.  I have reviewed the patients' chart and labs.  Questions were answered to the patient's satisfaction.     Fuller Canada

## 2011-07-21 NOTE — Anesthesia Postprocedure Evaluation (Signed)
  Anesthesia Post-op Note  Patient: Stacy Ashley  Procedure(s) Performed:  OPEN REDUCTION INTERNAL FIXATION (ORIF) SHOULDER FRACTURE - greater tuberosity; ROTATOR CUFF REPAIR SHOULDER OPEN  Patient Location: PACU  Anesthesia Type: General  Level of Consciousness: awake, alert  and oriented  Airway and Oxygen Therapy: Patient Spontanous Breathing and Patient connected to face mask oxygen  Post-op Pain: moderate  Post-op Assessment: Cardiovascular status stable, respiratory status stable, airway patent  Post-op Vital Signs: Reviewed and stable  Complications: No apparent anesthesia complications

## 2011-07-21 NOTE — Op Note (Signed)
Date of surgery 07/21/2011  07/21/2011  9:23 AM  PATIENT:  Stacy Ashley  66 y.o. female  PRE-OPERATIVE DIAGNOSIS:  left greater tuberosity fracture  POST-OPERATIVE DIAGNOSIS:  left greater tuberosity fracture  PROCEDURE:  Procedure(s): OPEN REDUCTION INTERNAL FIXATION (ORIF) SHOULDER FRACTURE ROTATOR CUFF REPAIR SHOULDER OPEN  Operative findings unstable nondisplaced greater tuberosity fragment. Rotator interval was intact. Supraspinatus tendon was attached to the greater tuberosity fragment.  SURGEON:  Surgeon(s): Fuller Canada, MD  PHYSICIAN ASSISTANT:   ASSISTANTS: DEBBIE DALLAS   ANESTHESIA:   general  EBL: 25   Total I/O In: 1000 [I.V.:1000] Out: 50 [Blood:50]  BLOOD ADMINISTERED:none  DRAINS: none   LOCAL MEDICATIONS USED:  MARCAINE WITH EPI 60 CC  SPECIMEN:  No Specimen  DISPOSITION OF SPECIMEN:  N/A  COUNTS:  YES  TOURNIQUET:  * No tourniquets in log *  DICTATION: .Dragon Dictation  PLAN OF CARE: Discharge to home after PACU  PATIENT DISPOSITION:  PACU - hemodynamically stable.   Delay start of Pharmacological VTE agent (>24hrs) due to surgical blood loss or risk of bleeding:     The patient was identified in the preop holding area the surgical site was confirmed and marked as the left shoulder. The chart was reviewed and updated. The patient was taken to the operating room given 2 g of Ancef based on weight of greater than 80 kg. She was then intubated. There are no complications with intubation  She was placed in the modified beachchair position with a sandbag under the left shoulder.  The left arm and shoulder were then prepped and draped sterilely  Timeout procedure was executed. Radiographs were present equipment was checked and instruments were available.  The subcutaneous tissue was injected with Marcaine half percent and epinephrine. An incision was made over the shoulder starting at the lateral edge of the acromion and taken down  over the between the middle and anterior deltoid. Subcutaneous tissue was then divided sharply. Blunt dissection was carried out between the anterior and middle heads of the deltoid and taken up to the acromion. The deltoid was then peeled from the acromion laterally and anteriorly in subperiosteal fashion  A bursectomy was then performed and the fracture fragment was identified and found to be nondisplaced but hypermobile and unstable. A #2 Ethibond suture was placed in the rotator cuff and then in a figure-of-eight fashion was brought around a 4.0 Synthes screw and washer and tied down with the arm in abduction. The shoulder was then taken through a range of motion to ensure that there was no impingement of the screw on the acromion. Rotator interval was then explored and found to be intact.  Drill holes were then placed in the acromion and #2 Ethibond sutures were passed through the acromion to repair the deltoid. The deltoid split was repaired with a running #2 Ethibond suture  The subcutaneous tissue was closed with 0 Monocryl and the subcuticular tissue was closed with 2-0 Monocryl.  The submuscular layer was injected with 30 cc of Marcaine with epinephrine,  and the skin was injected with 30 cc of Marcaine with epinephrine as well. Findings: Nondisplaced unstable greater tuberosity fragment left proximal humerus

## 2011-07-21 NOTE — H&P (Signed)
   Chief Complaint   Patient presents with   .  Shoulder Injury     Left shoulder injury on 06-17-11.   complaint dislocated LEFT shoulder with fractured humeral head  The patient complains of pain in the LEFT shoulder and numbness in the fingers of the LEFT hand  Started June 17, 2011  Started secondary to a fall at church called to [employer] seventh day advantageous to church  Location [facility]  Symptoms dull throbbing burning pain LEFT shoulder and LEFT hand including numbness in the fingers  Timing constant the numbness seems to come and go. Things are better with rest and worse with movement there is some bruising in the LEFT upper arm.  Review of systems is negative except for musculoskeletal findings and the neurologic findings as stated she also has seasonal ALLERGIES   Past Medical History  Diagnosis Date  . Carpal tunnel syndrome   . Complication of anesthesia 1985    pt sts bp dropped "really low" and they had trouble bringing me back  . Myocardial infarction 08-2009    Past Surgical History  Procedure Date  . Cesarean section     x3  . Lymphectomy right cervical  . Tubal ligation   . Lipoma excision 12/2007    bilateral ankles  . Septoplasty 04/2010    History   Social History  . Marital Status: Divorced    Spouse Name: N/A    Number of Children: N/A  . Years of Education: N/A   Occupational History  . Not on file.   Social History Main Topics  . Smoking status: Never Smoker   . Smokeless tobacco: Not on file  . Alcohol Use: No  . Drug Use: No  . Sexually Active: Yes    Birth Control/ Protection: Post-menopausal   Other Topics Concern  . Not on file   Social History Narrative  . No narrative on file     Ht 5\' 8"  (1.727 m)  Wt 253 lb (114.76 kg)  BMI 38.47 kg/m2  Physical Exam(12)  GENERAL: normal development  CDV: pulses are normal  Skin: normal  Lymph: nodes were not palpable/normal  Psychiatric: awake, alert and  oriented  Neuro: normal sensation  MSK The patient basically has no movement of the LEFT upper extremity at the shoulder joint.  1 The LEFT hand wrist and elbow have normal range of motion, on inspection there is no pain or tenderness. Strength is normal. Elbow wrist and hand are stable.  2 Neurologic exam reveals normal soft touch in the LEFT hand.  3 The cervical spine has normal range of motion with no increased muscle tension and stability or abnormal alignment  4 The patient's gait is normal.  Assessment: X-rays were on a disc they included one view on the AP after reduction. The dislocation film was not available. I repeated the lateral view and the shoulder is reduced. There is a greater tuberosity fracture. She most likely has a rotator cuff injury.  She may need repair the greater tuberosity. May need repair the cuff. PLAN: Open repair greater tuberosity fracture, plus or minus rotator cuff repair , LEFT shoulder

## 2011-07-21 NOTE — Addendum Note (Signed)
Addendum  created 07/21/11 1015 by Roselie Awkward, MD   Modules edited:Orders, PRL Based Order Sets

## 2011-07-24 ENCOUNTER — Ambulatory Visit (INDEPENDENT_AMBULATORY_CARE_PROVIDER_SITE_OTHER): Payer: Self-pay | Admitting: Orthopedic Surgery

## 2011-07-24 ENCOUNTER — Encounter (HOSPITAL_COMMUNITY): Payer: Self-pay | Admitting: Orthopedic Surgery

## 2011-07-24 VITALS — Ht 68.0 in | Wt 253.0 lb

## 2011-07-24 DIAGNOSIS — Z9889 Other specified postprocedural states: Secondary | ICD-10-CM

## 2011-07-24 MED ORDER — OXYCODONE-ACETAMINOPHEN 5-325 MG PO TABS: 1.0000 | ORAL_TABLET | ORAL | Status: AC | PRN

## 2011-07-24 NOTE — Patient Instructions (Addendum)
Wear sling   Keep clean and dry   Straighten arm 3 x a day for 30 minutes

## 2011-07-24 NOTE — Progress Notes (Signed)
Patient ID: Stacy Ashley, female   DOB: [birth year], 66 y.o.   MRN: [id number]  Postop visit #1  Postop day #3   Date of surgery 07/21/2011  07/21/2011  9:23 AM  PATIENT: Stacy Ashley 66 y.o. female  PRE-OPERATIVE DIAGNOSIS: left greater tuberosity fracture  POST-OPERATIVE DIAGNOSIS: left greater tuberosity fracture  PROCEDURE: Procedure(s):  OPEN REDUCTION INTERNAL FIXATION (ORIF) SHOULDER FRACTURE  ROTATOR CUFF REPAIR SHOULDER OPEN  Operative findings unstable nondisplaced greater tuberosity fragment. Rotator interval was intact. Supraspinatus tendon was attached to the greater tuberosity fragment.   Doing well.  Dressing changed.  Dressing looks clean.  Incision looks clean.  Return in approximately 10 days for wound check continue wearing sling.

## 2011-08-03 ENCOUNTER — Ambulatory Visit (INDEPENDENT_AMBULATORY_CARE_PROVIDER_SITE_OTHER): Payer: Self-pay | Admitting: Orthopedic Surgery

## 2011-08-03 ENCOUNTER — Encounter: Payer: Self-pay | Admitting: Orthopedic Surgery

## 2011-08-03 VITALS — BP 150/88 | Ht 68.0 in | Wt 253.0 lb

## 2011-08-03 DIAGNOSIS — S42253A Displaced fracture of greater tuberosity of unspecified humerus, initial encounter for closed fracture: Secondary | ICD-10-CM

## 2011-08-03 HISTORY — DX: Displaced fracture of greater tuberosity of unspecified humerus, initial encounter for closed fracture: S42.253A

## 2011-08-03 NOTE — Progress Notes (Signed)
Patient ID: Stacy Ashley, female   DOB: 08-04-1945, 66 y.o.   MRN: [id number]   Postop visit #2  Postop day 13  LEFT shoulder greater tuberosity internal fixation.  Surgery date February 1  The patient is feeling good at this point.  She takes one ibuprofen a day seems to have more trouble with muscle spasm.  She is in a sling.  Her incision looks good.  She can start therapy in a week.  Return in 6 weeks.

## 2011-08-03 NOTE — Patient Instructions (Signed)
Write a letter for her that says  It is ok for her to take care of her child  Start OT at [facility] in 1 week

## 2011-08-09 ENCOUNTER — Other Ambulatory Visit: Payer: Self-pay | Admitting: *Deleted

## 2011-08-09 DIAGNOSIS — S42253A Displaced fracture of greater tuberosity of unspecified humerus, initial encounter for closed fracture: Secondary | ICD-10-CM

## 2011-08-14 ENCOUNTER — Ambulatory Visit (HOSPITAL_COMMUNITY)
Admission: RE | Admit: 2011-08-14 | Discharge: 2011-08-14 | Disposition: A | Discharge status: Home / Self Care | Payer: Self-pay | Source: Ambulatory Visit | Attending: Orthopedic Surgery | Admitting: Orthopedic Surgery

## 2011-08-14 DIAGNOSIS — IMO0001 Reserved for inherently not codable concepts without codable children: Secondary | ICD-10-CM | POA: Insufficient documentation

## 2011-08-14 DIAGNOSIS — M25629 Stiffness of unspecified elbow, not elsewhere classified: Secondary | ICD-10-CM | POA: Insufficient documentation

## 2011-08-14 DIAGNOSIS — M25519 Pain in unspecified shoulder: Secondary | ICD-10-CM | POA: Insufficient documentation

## 2011-08-14 DIAGNOSIS — M6281 Muscle weakness (generalized): Secondary | ICD-10-CM | POA: Insufficient documentation

## 2011-08-14 DIAGNOSIS — M25619 Stiffness of unspecified shoulder, not elsewhere classified: Secondary | ICD-10-CM | POA: Insufficient documentation

## 2011-08-14 NOTE — Evaluation (Signed)
Physical Therapy Evaluation  Patient Details  Name: Stacy Ashley MRN: [id number] Date of Birth: 1945/08/14  Today's Date: 08/14/2011 Time: 1445-1550 Time Calculation (min): 65 min  Visit#: 1  of 12   Re-eval: 09/13/11 Assessment Diagnosis: Greater tuerosity fracture Surgical Date: 07/21/11 Next MD Visit: 10/02/11 Prior Therapy: OT for wrists.  Past Medical History:  Past Medical History  Diagnosis Date  . Carpal tunnel syndrome   . Complication of anesthesia 1985    pt sts bp dropped "really low" and they had trouble bringing me back  . Myocardial infarction 08-2009   Past Surgical History:  Past Surgical History  Procedure Date  . Cesarean section     x3  . Lymphectomy right cervical  . Tubal ligation   . Lipoma excision 12/2007    bilateral ankles  . Septoplasty 04/2010  . Orif shoulder fracture 07/21/2011    Procedure: OPEN REDUCTION INTERNAL FIXATION (ORIF) SHOULDER FRACTURE;  Surgeon: Fuller Canada, MD;  Location: [facility];  Service: Orthopedics;  Laterality: Left;  greater tuberosity  . Shoulder open rotator cuff repair 07/21/2011    Procedure: ROTATOR CUFF REPAIR SHOULDER OPEN;  Surgeon: Fuller Canada, MD;  Location: [facility];  Service: Orthopedics;  Laterality: Left;    Subjective Symptoms/Limitations Symptoms: Stacy Ashley states that she was at church when I fell and fractured my shoulder on 06/17/11.   I could not lift my arm and the MD found out that My rotator cuff was torn it was repaired on 08/03/11.  She states that since the surgery she is  90% bettter pain wise; using her arm she is unable. Special Tests: The patient is right handed she is unable to lift her arm greater than 30 degrees in forward flexion while sitting; no greater than 30 degree abduction sitting or 55 sitting.  She is unable to reach back to her back pocket  Pain Assessment Currently in Pain?: Yes Pain Score:   4 Pain Location: Arm (deltoid tuberosity.) Pain Orientation: Left Pain Type:  Chronic pain  Precautions/Restrictions     Prior Functioning  Home Living Lives With: Daughter (foster daughter who is 2; adult daughter is staying 3/8) Receives Help From: Family (adult daugther is staying with her until 08/24/11.)  Cognition/Observation  Pt has difficulty putting coat on  Assessment RUE Strength Right Shoulder Flexion: 5/5 Right Shoulder Extension: 5/5 Right Shoulder ABduction: 5/5 Right Shoulder Internal Rotation: 5/5 Right Shoulder External Rotation: 5/5 Right Elbow Flexion: 5/5 Right Elbow Extension: 5/5 Grip (lbs): 88# LUE AROM (degrees) Left Shoulder Extension  0-60: 22 Degrees Left Shoulder Flexion  0-170: 95 Degrees Left Shoulder ABduction 0-40: 65 Degrees Left Shoulder Internal Rotation  0-70: 70 Degrees Left Shoulder External Rotation  0-90: 45 Degrees LUE PROM (degrees) Left Shoulder Flexion  0-170: 115 Degrees Left Shoulder ABduction 0-40: 100 Degrees Left Shoulder Internal Rotation  0-70: 70 Degrees Left Shoulder External Rotation  0-90: 50 Degrees LUE Strength Left Shoulder Flexion: 2/5 Left Shoulder Extension: 2-/5 Left Shoulder ABduction: 2-/5 Left Shoulder Internal Rotation: 2+/5 Left Shoulder External Rotation: 2/5 Left Elbow Flexion: 3/5 Left Elbow Extension: 3/5 Grip (lbs): 65#  Exercise/Treatments Supine External Rotation: 10 reps Internal Rotation: 10 reps Flexion: 10 reps;Limitations Flexion Limitations: wand; 5x Active ABduction: Strengthening;5 reps;Limitations ABduction Limitations: wand; 3 x active   Standing Other Standing Exercises: extension  B x 10   Physical Therapy Assessment and Plan PT Assessment and Plan Clinical Impression Statement: Pt with decreased ROM, decreased strength and decreased funtional use of left  UE whoe swill benefit from skilled Pt to restore function to prior functional level. Rehab Potential: Good Clinical Impairments Affecting Rehab Potential: weakness, stiffness PT Frequency: Min  2X/week PT Duration: 6 weeks PT Treatment/Interventions: Therapeutic activities;Therapeutic exercise;Other (comment) (modalities for pain/swelling as needed) PT Plan: Pt to be seen twice a week for 6 weeks.  Next treatment begin UBE forward and back; ball flex, ab, and round; sidelying Abduction/ER and PROM;   third treatment add pulleys and sitting flex/ ab.  Fourth treatment add wall walk, prone extension and prone ER    Goals Home Exercise Program Pt will Perform Home Exercise Program: Independently PT Short Term Goals Time to Complete Short Term Goals: 2 weeks PT Short Term Goal 1: Pt standing AROM to be to shoulder height to be able to reach into cabinets. PT Short Term Goal 2: Pt strength to be increased by 1/2 grade  PT Short Term Goal 3: Pt to be able to wash her back. PT Long Term Goals Time to Complete Long Term Goals: 4 weeks PT Long Term Goal 1: Pt ROM to be at least 150 degrees when standing to be able to reach into higher cabinets to put things in/out PT Long Term Goal 2: strength to be increased one grade to be able to do # 1 Long Term Goal 3: Pt to be able to lift grocery sack with L hand.  Problem List Patient Active Problem List  Diagnoses  . 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 MORBID OBESITY  . CELLULITIS, LEG, RIGHT  . HIDRADENITIS  . CLOSED FRACTURE OF BASE OF OTHER METACARPAL BONE  . Pain in wrist joint  . Muscle weakness (generalized)  . Fracture dislocation of shoulder joint  . Greater tuberosity of humerus fracture  . Stiffness of upper arm joint  . Weakness of left arm    PT - End of Session Activity Tolerance: Patient tolerated treatment well General Behavior During Session: [facility] for tasks performed Cognition: [facility] for tasks performed PT Plan of Care PT Home Exercise Plan: Given Consulted and Agree with Plan of Care: Patient  Brilee Port,CINDY 08/14/2011, 4:03 PM  Physician Documentation Your signature is required to indicate approval of  the treatment plan as stated above.  Please sign and either send electronically or make a copy of this report for your files and return this physician signed original.   Please mark one 1.__approve of plan  2. ___approve of plan with the following conditions.   ______________________________                                                          _____________________ Physician Signature                                                                                                             Date

## 2011-08-16 ENCOUNTER — Ambulatory Visit (HOSPITAL_COMMUNITY)
Admission: RE | Admit: 2011-08-16 | Discharge: 2011-08-16 | Disposition: A | Discharge status: Home / Self Care | Payer: Self-pay | Source: Ambulatory Visit | Attending: Orthopedic Surgery | Admitting: Orthopedic Surgery

## 2011-08-16 NOTE — Progress Notes (Signed)
Physical Therapy Treatment Patient Details  Name: Stacy Ashley MRN: [id number] Date of Birth: 09-05-1945  Today's Date: 08/16/2011 Time: 4098-1191 Time Calculation (min): 47 min Visit#: 2  of 12   Re-eval: 09/13/11 Charges: Therex x 28' Ice x 10'  Subjective: Symptoms/Limitations Symptoms: Pt states that she is having pain in her L upper arm. Pain Assessment Currently in Pain?: Yes Pain Score:   3 Pain Location: Arm (deltoid tuberosity) Pain Orientation: Left  Exercise/Treatments Supine External Rotation: 10 reps Internal Rotation: 10 reps Flexion: 10 reps;Limitations Flexion Limitations: 10 x w/wand; 5 x active ABduction: 10 reps;Limitations ABduction Limitations: 10x w/wand; 5 x active Sidelying External Rotation: 10 reps ABduction: Limitations ABduction Limitations: 3x Standing Other Standing Exercises: extension  B x 10 Therapy Ball Flexion: 10 reps ABduction: 10 reps Right/Left: 10 reps    Modalities Modalities: Cryotherapy Cryotherapy Number Minutes Cryotherapy: 10 Minutes Cryotherapy Location: Shoulder;Upper arm (Left) Type of Cryotherapy: Ice pack  Physical Therapy Assessment and Plan PT Assessment and Plan Clinical Impression Statement: Began warm-up on upright bike as it was thought that she had one at home. Pt reports that she does not have an upright bike at home she has an elliptical. Pt does well with exercises. Pt reports that she does not have pain until she gets near the end range of motion. Ice applied to L shoulder at end of session to limit inflammation. PT Plan: Continue to progress per PT POC. Begin add pulleys and sitting flex/ abd next session.    Problem List Patient Active Problem List  Diagnoses  . 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 MORBID OBESITY  . CELLULITIS, LEG, RIGHT  . HIDRADENITIS  . CLOSED FRACTURE OF BASE OF OTHER METACARPAL BONE  . Pain in wrist joint  . Muscle weakness (generalized)  . Fracture  dislocation of shoulder joint  . Greater tuberosity of humerus fracture  . Stiffness of upper arm joint  . Weakness of left arm    PT - End of Session Activity Tolerance: Patient tolerated treatment well General Behavior During Session: [facility] for tasks performed Cognition: [facility] for tasks performed    Antonieta Iba 08/16/2011, 9:00 AM

## 2011-08-21 ENCOUNTER — Ambulatory Visit (HOSPITAL_COMMUNITY)
Admission: RE | Admit: 2011-08-21 | Discharge: 2011-08-21 | Disposition: A | Discharge status: Home / Self Care | Payer: Self-pay | Source: Ambulatory Visit | Attending: Chiropractic Medicine | Admitting: Chiropractic Medicine

## 2011-08-21 ENCOUNTER — Ambulatory Visit (HOSPITAL_COMMUNITY): Payer: Self-pay | Admitting: Physical Therapy

## 2011-08-21 ENCOUNTER — Telehealth (HOSPITAL_COMMUNITY): Payer: Self-pay

## 2011-08-21 DIAGNOSIS — IMO0001 Reserved for inherently not codable concepts without codable children: Secondary | ICD-10-CM | POA: Insufficient documentation

## 2011-08-21 DIAGNOSIS — M25629 Stiffness of unspecified elbow, not elsewhere classified: Secondary | ICD-10-CM | POA: Insufficient documentation

## 2011-08-21 DIAGNOSIS — M25529 Pain in unspecified elbow: Secondary | ICD-10-CM | POA: Insufficient documentation

## 2011-08-21 DIAGNOSIS — M6281 Muscle weakness (generalized): Secondary | ICD-10-CM | POA: Insufficient documentation

## 2011-08-21 NOTE — Progress Notes (Signed)
Physical Therapy Treatment Patient Details  Name: Stacy Ashley MRN: [id number] Date of Birth: Nov 20, 1945  Today's Date: 08/21/2011 Time: 5366-4403 Time Calculation (min): 52 min Visit#: 3  of 12   Re-eval: 09/13/11 Charges: Therex x 38' Ice x 10'  Subjective: Symptoms/Limitations Symptoms: Pt. reports she has no pain, doing great.  Pain Assessment Currently in Pain?: No/denies   Exercise/Treatments Supine Flexion: 15 reps;AROM Other Supine Exercises: star gazers x 10 Sidelying External Rotation: 10 reps;Weights External Rotation Weight (lbs): 2 ABduction: 10 reps Standing Other Standing Exercises: extension  B x 15 Other Standing Exercises: red ball roll on wall for flexion x 5 Pulleys Flexion: 2 minutes ABduction: 2 minutes Therapy Ball Flexion: 10 reps ABduction: 10 reps Right/Left: 10 reps ROM / Strengthening / Isometric Strengthening Wall Wash: 2' L Over Head Lace: 2'  Modalities Modalities: Cryotherapy Cryotherapy Number Minutes Cryotherapy: 10 Minutes Cryotherapy Location: Shoulder (Left) Type of Cryotherapy: Ice pack  Physical Therapy Assessment and Plan PT Assessment and Plan Clinical Impression Statement: Pt displays significant improvement in AROM. Pt completes exercises with increased ease. Began new exercises per doc flow sheets to improve functional endurance. Pt is without complaint throughout session. PT Plan: Continue per PT POC. Begin sitting flex/ abd next session     Problem List Patient Active Problem List  Diagnoses  . 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 MORBID OBESITY  . CELLULITIS, LEG, RIGHT  . HIDRADENITIS  . CLOSED FRACTURE OF BASE OF OTHER METACARPAL BONE  . Pain in wrist joint  . Muscle weakness (generalized)  . Fracture dislocation of shoulder joint  . Greater tuberosity of humerus fracture  . Stiffness of upper arm joint  . Weakness of left arm    PT - End of Session Activity Tolerance: Patient tolerated  treatment well General Behavior During Session: [facility] for tasks performed Cognition: [facility] for tasks performed    Antonieta Iba 08/21/2011, 5:47 PM

## 2011-08-23 ENCOUNTER — Ambulatory Visit (HOSPITAL_COMMUNITY)
Admission: RE | Admit: 2011-08-23 | Discharge: 2011-08-23 | Disposition: A | Discharge status: Home / Self Care | Payer: Self-pay | Source: Ambulatory Visit | Attending: Orthopedic Surgery | Admitting: Orthopedic Surgery

## 2011-08-23 NOTE — Progress Notes (Signed)
Physical Therapy Treatment Patient Details  Name: Stacy Ashley MRN: [id number] Date of Birth: 08-30-1945  Today's Date: 08/23/2011 Time: 5621-3086 Time Calculation (min): 59 min Visit#: 4  of 12   Re-eval: 09/13/11 Charges:  therex 40', icepack 15'    Subjective: Symptoms/Limitations Symptoms: Pt. states she has no pain, only c/o L bicep soreness/tightness.  States she's been going to the [facility] too. Pain Assessment Currently in Pain?: No/denies   Exercise/Treatments Supine Flexion: 15 reps;Weights Shoulder Flexion Weight (lbs): 3# wand ABduction: 15 reps;Weights Shoulder ABduction Weight (lbs): 3# wand Other Supine Exercises: star gazers x 15 Sidelying External Rotation: 15 reps External Rotation Weight (lbs): 3 ABduction: 15 reps;Weights ABduction Weight (lbs): 3 Standing Flexion: 10 reps ABduction: 10 reps Other Standing Exercises: extension  B x 15 Other Standing Exercises: red ball roll on wall for flexion x 5 Pulleys Flexion: 2 minutes ABduction: 2 minutes Therapy Ball Flexion:  (D/C) ROM / Strengthening / Isometric Strengthening UBE (Upper Arm Bike): 4'/ 2'fwd/2'bkwd Wall Wash: time Over Head Lace: 2'    Modalities Modalities: Cryotherapy Cryotherapy Number Minutes Cryotherapy: 15 Minutes Cryotherapy Location: Shoulder Type of Cryotherapy: Ice pack  Physical Therapy Assessment and Plan PT Assessment and Plan Clinical Impression Statement: Pt. with improving ROM; pt. very compliant and working on exercises outside therapy at the [facility].  Began standing flexion/abd to increase AROM and strength with pt. able to complete 60-70% of ROM. PT Plan: Continue to improve ROM and strength.     Problem List Patient Active Problem List  Diagnoses  . 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 MORBID OBESITY  . CELLULITIS, LEG, RIGHT  . HIDRADENITIS  . CLOSED FRACTURE OF BASE OF OTHER METACARPAL BONE  . Pain in wrist joint  . Muscle weakness  (generalized)  . Fracture dislocation of shoulder joint  . Greater tuberosity of humerus fracture  . Stiffness of upper arm joint  . Weakness of left arm    PT - End of Session Activity Tolerance: Patient tolerated treatment well General Behavior During Session: Stacy Ashley for tasks performed Cognition: [name] for tasks performed   Stacy Ashley B. Stacy Ashley, PTA 08/23/2011, 9:31 AM

## 2011-08-28 ENCOUNTER — Ambulatory Visit (HOSPITAL_COMMUNITY): Payer: Self-pay | Admitting: *Deleted

## 2011-08-29 ENCOUNTER — Ambulatory Visit (HOSPITAL_COMMUNITY)
Admission: RE | Admit: 2011-08-29 | Discharge: 2011-08-29 | Disposition: A | Discharge status: Home / Self Care | Payer: Self-pay | Source: Ambulatory Visit | Attending: Orthopedic Surgery | Admitting: Orthopedic Surgery

## 2011-08-29 NOTE — Progress Notes (Signed)
Physical Therapy Treatment Patient Details  Name: KEISI ECKFORD MRN: [id number] Date of Birth: April 15, 1946  Today's Date: 08/29/2011 Time: 4098-1191 Time Calculation (min): 54 min Visit#: 5  of 12   Re-eval: 09/13/11 Charges: Therex x 38' Ice x 10'  Subjective: Symptoms/Limitations Symptoms: Pt states that she is not having any pain, just tightness. Pain Assessment Currently in Pain?: No/denies Pain Score: 0-No pain   Exercise/Treatments Supine Protraction: 10 reps;Weights Protraction Weight (lbs): 3# wand Flexion: 15 reps;Weights Shoulder Flexion Weight (lbs): 3# wand ABduction: 15 reps;Weights Shoulder ABduction Weight (lbs): 3# wand Standing Flexion: 10 reps ABduction: 10 reps Other Standing Exercises: extension  B x 15 Other Standing Exercises: red ball roll on wall for flexion x 5 Pulleys Flexion: 2 minutes ABduction: 2 minutes ROM / Strengthening / Isometric Strengthening Wall Wash: 2' Over Head Lace: 2' Other ROM/Strengthening Exercises: B rows and ext w/ grn tband x 10 each Other ROM/Strengthening Exercises: Elliptical L1 x 5' (at end of session per pt request)  Modalities Modalities: Cryotherapy Cryotherapy Number Minutes Cryotherapy: 10 Minutes Cryotherapy Location: Shoulder (Left) Type of Cryotherapy: Ice pack  Physical Therapy Assessment and Plan PT Assessment and Plan Clinical Impression Statement: Pt continues to progress well. Pt's endurance appears to be improving. Pt is without complaint throughout session. Began scapular tband exercises. Pt unable to complete retraction with proper form secondary to weakness/decreased ROM. HEP given for tband exercises pt instructed not to complete retraction until form has improved. Ice applied at end of session to limit inflammation and pain. PT Plan: Continue to progress per PT POC.     Problem List Patient Active Problem List  Diagnoses  . 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 MORBID OBESITY    . CELLULITIS, LEG, RIGHT  . HIDRADENITIS  . CLOSED FRACTURE OF BASE OF OTHER METACARPAL BONE  . Pain in wrist joint  . Muscle weakness (generalized)  . Fracture dislocation of shoulder joint  . Greater tuberosity of humerus fracture  . Stiffness of upper arm joint  . Weakness of left arm    PT - End of Session Activity Tolerance: Patient tolerated treatment well General Behavior During Session: [facility] for tasks performed Cognition: [facility] for tasks performed   Seth Bake, PTA 08/29/2011, 9:36 AM

## 2011-08-30 ENCOUNTER — Ambulatory Visit (HOSPITAL_COMMUNITY)
Admission: RE | Admit: 2011-08-30 | Discharge: 2011-08-30 | Disposition: A | Discharge status: Home / Self Care | Payer: Self-pay | Source: Ambulatory Visit | Attending: Orthopedic Surgery | Admitting: Orthopedic Surgery

## 2011-08-30 NOTE — Progress Notes (Signed)
Physical Therapy Treatment Patient Details  Name: Stacy Ashley MRN: [id number] Date of Birth: 05-Mar-1946  Today's Date: 08/30/2011 Time: 0812-0910 Time Calculation (min): 58 min Visit#: 6  of 12   Re-eval: 09/13/11 Charges:  therex 40', ice 10'    Subjective: Symptoms/Limitations Symptoms: Pt. late for appt. today; states she has a little pain today, 2/10 Pain Assessment Currently in Pain?: Yes Pain Score:   2 Pain Location: Shoulder Pain Orientation: Left   Exercise/Treatments Supine Protraction: 15 reps;Weights Protraction Weight (lbs): 4# wand Flexion: 15 reps;Weights Shoulder Flexion Weight (lbs): 4# wand ABduction: 15 reps;Weights Shoulder ABduction Weight (lbs): 4# wand Sidelying External Rotation: 15 reps External Rotation Weight (lbs): 3 ABduction: 15 reps ABduction Weight (lbs): 0 Standing Flexion: 15 reps ABduction: 15 reps Other Standing Exercises: extension  B x 15 Other Standing Exercises: red ball roll on wall for flexion x10 Pulleys Flexion: 2 minutes ABduction: 2 minutes ROM / Strengthening / Isometric Strengthening UBE (Upper Arm Bike): 4'/ 2'fwd/2'bkwd Wall Wash: 2' Over Head Lace: 2'     Physical Therapy Assessment and Plan PT Assessment and Plan Clinical Impression Statement: Pt. with improving functional strength and ROM.   Able to complete all exercises without c/o pain; Able to increase to 4# bar today with supine therex, unable to complete SL shoulder abduction with weight but could without weight. PT Plan: Continue to progress per POC.     Problem List Patient Active Problem List  Diagnoses  . 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 MORBID OBESITY  . CELLULITIS, LEG, RIGHT  . HIDRADENITIS  . CLOSED FRACTURE OF BASE OF OTHER METACARPAL BONE  . Pain in wrist joint  . Muscle weakness (generalized)  . Fracture dislocation of shoulder joint  . Greater tuberosity of humerus fracture  . Stiffness of upper arm joint  .  Weakness of left arm    PT - End of Session Activity Tolerance: Patient tolerated treatment well General Behavior During Session: [facility] for tasks performed Cognition: [facility] for tasks performed  Lucious Zou B. Bascom Levels, PTA 08/30/2011, 8:59 AM

## 2011-09-04 ENCOUNTER — Inpatient Hospital Stay (HOSPITAL_COMMUNITY): Admission: RE | Admit: 2011-09-04 | Payer: Self-pay | Source: Ambulatory Visit | Admitting: Physical Therapy

## 2011-09-05 ENCOUNTER — Ambulatory Visit (HOSPITAL_COMMUNITY)
Admission: RE | Admit: 2011-09-05 | Discharge: 2011-09-05 | Disposition: A | Discharge status: Home / Self Care | Payer: Self-pay | Source: Ambulatory Visit | Attending: Orthopedic Surgery | Admitting: Orthopedic Surgery

## 2011-09-05 NOTE — Progress Notes (Signed)
Physical Therapy Treatment Patient Details  Name: Stacy Ashley MRN: [id number] Date of Birth: Nov 03, 1945  Today's Date: 09/05/2011 Time: 4098-1191 Time Calculation (min): 62 min Visit#: 7  of 12   Re-eval: 09/13/11 Charges: Therex x 40' MHP x 15'  Subjective: Symptoms/Limitations Symptoms: I'm hurting today. (Pt denies any activity that might have increased her pain.) Pain Assessment Currently in Pain?: Yes Pain Score:   4 Pain Location: Shoulder Pain Orientation: Left   Exercise/Treatments Supine Protraction: 15 reps;Weights Protraction Weight (lbs): 4# wand Flexion: 15 reps;Weights Shoulder Flexion Weight (lbs): 15 x w/4# wand; 15x AROM ABduction: 5 reps Shoulder ABduction Weight (lbs): AROM Sidelying External Rotation: 15 reps External Rotation Weight (lbs): 3 ABduction: 10 reps ABduction Weight (lbs): 3 Standing Flexion: 15 reps ABduction: 15 reps Other Standing Exercises: extension  B x 15 w/ grn tband Other Standing Exercises: red ball roll on wall for flexion x10 Pulleys Flexion: 2 minutes ABduction: 2 minutes ROM / Strengthening / Isometric Strengthening UBE (Upper Arm Bike): 4'/ 2'fwd/2'bkwd Wall Wash: 2' Over Head Lace: 2'   Modalities Modalities: Moist Heat Moist Heat Therapy Number Minutes Moist Heat: 15 Minutes Moist Heat Location: Shoulder (R shoulder and bicep)  Physical Therapy Assessment and Plan PT Assessment and Plan Clinical Impression Statement: Pt is unable to complete full ROM for abd and flexion in standing. Exercise moved to supine so that pat can complete exercises in full range. Pt is able to complete full range for abd in SL with 3# wt without difficulty. Pt requests MHP at end of session  as this has helped her in the past. Pt reports a little stiffness and 0/10 pain at end of session. PT Plan: Continue per PT POC. D/C dowel exercises next session. Progress active motion in supine.     Problem List Patient Active Problem  List  Diagnoses  . 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 MORBID OBESITY  . CELLULITIS, LEG, RIGHT  . HIDRADENITIS  . CLOSED FRACTURE OF BASE OF OTHER METACARPAL BONE  . Pain in wrist joint  . Muscle weakness (generalized)  . Fracture dislocation of shoulder joint  . Greater tuberosity of humerus fracture  . Stiffness of upper arm joint  . Weakness of left arm    PT - End of Session Activity Tolerance: Patient tolerated treatment well General Behavior During Session: [facility] for tasks performed Cognition: [facility] for tasks performed   Seth Bake, PTA 09/05/2011, 9:52 AM

## 2011-09-06 ENCOUNTER — Ambulatory Visit (HOSPITAL_COMMUNITY): Payer: Self-pay

## 2011-09-07 ENCOUNTER — Ambulatory Visit (HOSPITAL_COMMUNITY): Payer: Self-pay | Admitting: *Deleted

## 2011-09-07 ENCOUNTER — Telehealth (HOSPITAL_COMMUNITY): Payer: Self-pay

## 2011-09-12 ENCOUNTER — Telehealth (HOSPITAL_COMMUNITY): Payer: Self-pay

## 2011-09-12 ENCOUNTER — Emergency Department (HOSPITAL_COMMUNITY)
Admission: EM | Admit: 2011-09-12 | Discharge: 2011-09-12 | Disposition: A | Discharge status: Home / Self Care | Payer: Self-pay | Attending: Emergency Medicine | Admitting: Emergency Medicine

## 2011-09-12 ENCOUNTER — Encounter (HOSPITAL_COMMUNITY): Payer: Self-pay | Admitting: Emergency Medicine

## 2011-09-12 ENCOUNTER — Ambulatory Visit (HOSPITAL_COMMUNITY): Payer: Self-pay | Admitting: Physical Therapy

## 2011-09-12 ENCOUNTER — Emergency Department (HOSPITAL_COMMUNITY): Payer: Self-pay

## 2011-09-12 DIAGNOSIS — J40 Bronchitis, not specified as acute or chronic: Secondary | ICD-10-CM | POA: Insufficient documentation

## 2011-09-12 DIAGNOSIS — R5381 Other malaise: Secondary | ICD-10-CM | POA: Insufficient documentation

## 2011-09-12 DIAGNOSIS — R509 Fever, unspecified: Secondary | ICD-10-CM | POA: Insufficient documentation

## 2011-09-12 DIAGNOSIS — R1110 Vomiting, unspecified: Secondary | ICD-10-CM | POA: Insufficient documentation

## 2011-09-12 DIAGNOSIS — R5383 Other fatigue: Secondary | ICD-10-CM | POA: Insufficient documentation

## 2011-09-12 DIAGNOSIS — R51 Headache: Secondary | ICD-10-CM | POA: Insufficient documentation

## 2011-09-12 DIAGNOSIS — R059 Cough, unspecified: Secondary | ICD-10-CM | POA: Insufficient documentation

## 2011-09-12 DIAGNOSIS — R630 Anorexia: Secondary | ICD-10-CM | POA: Insufficient documentation

## 2011-09-12 DIAGNOSIS — J3489 Other specified disorders of nose and nasal sinuses: Secondary | ICD-10-CM | POA: Insufficient documentation

## 2011-09-12 DIAGNOSIS — R070 Pain in throat: Secondary | ICD-10-CM | POA: Insufficient documentation

## 2011-09-12 DIAGNOSIS — R062 Wheezing: Secondary | ICD-10-CM | POA: Insufficient documentation

## 2011-09-12 DIAGNOSIS — R05 Cough: Secondary | ICD-10-CM | POA: Insufficient documentation

## 2011-09-12 LAB — COMPREHENSIVE METABOLIC PANEL
ALT: 10 U/L (ref 0–35)
Albumin: 3.6 g/dL (ref 3.5–5.2)
Alkaline Phosphatase: 109 U/L (ref 39–117)
Calcium: 9 mg/dL (ref 8.4–10.5)
Potassium: 3.6 mEq/L (ref 3.5–5.1)
Sodium: 132 mEq/L — ABNORMAL LOW (ref 135–145)
Total Protein: 7 g/dL (ref 6.0–8.3)

## 2011-09-12 LAB — CBC
MCHC: 32 g/dL (ref 30.0–36.0)
RDW: 14.2 % (ref 11.5–15.5)

## 2011-09-12 LAB — URINALYSIS, ROUTINE W REFLEX MICROSCOPIC
Bilirubin Urine: NEGATIVE
Ketones, ur: 15 mg/dL — AB
Nitrite: NEGATIVE
Urobilinogen, UA: 0.2 mg/dL (ref 0.0–1.0)

## 2011-09-12 LAB — URINE MICROSCOPIC-ADD ON

## 2011-09-12 MED ORDER — MOXIFLOXACIN HCL 400 MG PO TABS: 400.0000 mg | ORAL_TABLET | Freq: Every day | ORAL | Status: AC

## 2011-09-12 MED ORDER — DIPHENHYDRAMINE HCL 50 MG/ML IJ SOLN
25.0000 mg | Freq: Once | INTRAMUSCULAR | Status: AC
  Administered 2011-09-12: 25 mg via INTRAVENOUS
  Filled 2011-09-12: qty 1

## 2011-09-12 MED ORDER — KETOROLAC TROMETHAMINE 30 MG/ML IJ SOLN
30.0000 mg | Freq: Once | INTRAMUSCULAR | Status: AC
  Administered 2011-09-12: 30 mg via INTRAVENOUS
  Filled 2011-09-12: qty 1

## 2011-09-12 MED ORDER — ONDANSETRON HCL 4 MG PO TABS: 4.0000 mg | ORAL_TABLET | Freq: Four times a day (QID) | ORAL | Status: AC

## 2011-09-12 MED ORDER — SODIUM CHLORIDE 0.9 % IV BOLUS (SEPSIS)
1000.0000 mL | Freq: Once | INTRAVENOUS | Status: AC
  Administered 2011-09-12: 1000 mL via INTRAVENOUS

## 2011-09-12 MED ORDER — METOCLOPRAMIDE HCL 5 MG/ML IJ SOLN
10.0000 mg | Freq: Once | INTRAMUSCULAR | Status: AC
  Administered 2011-09-12: 10 mg via INTRAVENOUS
  Filled 2011-09-12: qty 2

## 2011-09-12 MED ORDER — ALBUTEROL SULFATE HFA 108 (90 BASE) MCG/ACT IN AERS
2.0000 | INHALATION_SPRAY | RESPIRATORY_TRACT | Status: DC | PRN
  Administered 2011-09-12: 2 via RESPIRATORY_TRACT
  Filled 2011-09-12: qty 6.7

## 2011-09-12 MED ORDER — ACETAMINOPHEN 500 MG PO TABS
1000.0000 mg | ORAL_TABLET | Freq: Once | ORAL | Status: AC
  Administered 2011-09-12: 1000 mg via ORAL
  Filled 2011-09-12: qty 2

## 2011-09-12 MED ORDER — ALBUTEROL SULFATE (5 MG/ML) 0.5% IN NEBU
5.0000 mg | INHALATION_SOLUTION | Freq: Once | RESPIRATORY_TRACT | Status: AC
  Administered 2011-09-12: 5 mg via RESPIRATORY_TRACT
  Filled 2011-09-12: qty 0.5

## 2011-09-12 MED ORDER — IPRATROPIUM BROMIDE 0.02 % IN SOLN
0.5000 mg | Freq: Once | RESPIRATORY_TRACT | Status: AC
  Administered 2011-09-12: 0.5 mg via RESPIRATORY_TRACT
  Filled 2011-09-12: qty 2.5

## 2011-09-12 NOTE — ED Notes (Signed)
Headache, nasal congestion, coughing, emesis and fever since Sunday.

## 2011-09-12 NOTE — Discharge Instructions (Signed)
Return to the ED with any concerns including difficulty breathing, vomiting and not able to keep down liquids or medications, fainting, decreased level of alertness/lethargy, or any other alarming symptoms  You should use the albuterol inhaler every 4 hours while awake, then as breathing improves and cough decreases you can space the inhaler out gradually to an as needed basis

## 2011-09-12 NOTE — ED Provider Notes (Signed)
History   This chart was scribed for Ethelda Chick, MD by Brooks Sailors. The patient was seen in room APAH7/APAH7. Patient's care was started at 1341.   CSN: [id number]  Arrival date & time 09/12/11  1341   First MD Initiated Contact with Patient 09/12/11 1402      Chief Complaint  Patient presents with  . Emesis    (Consider location/radiation/quality/duration/timing/severity/associated sxs/prior treatment) HPI Stacy Ashley is a 66 y.o. female who presents to the Emergency Department complaining of constant moderate generalized weakness onset three days ago with associated cough, nasal congestion, emesis, fever, sore throat and HA. Most recent emesis was yesterday. Patient is very weak with a decreased appetite and fever measured at 100 at home. Recent contact with granddaughter who was sick. Previous history of MI. There are no other associated symptoms.  Exertion make her feel more fatigued, there are no other alleviating or modifying factors. Cough is nonproductive.  She endorses one episode of emesis- yesterday, nonbilious and nonbloody  Past Medical History  Diagnosis Date  . Carpal tunnel syndrome   . Complication of anesthesia 1985    pt sts bp dropped "really low" and they had trouble bringing me back  . Myocardial infarction 08-2009    Past Surgical History  Procedure Date  . Cesarean section     x3  . Lymphectomy right cervical  . Tubal ligation   . Lipoma excision 12/2007    bilateral ankles  . Septoplasty 04/2010  . Orif shoulder fracture 07/21/2011    Procedure: OPEN REDUCTION INTERNAL FIXATION (ORIF) SHOULDER FRACTURE;  Surgeon: Fuller Canada, MD;  Location: [facility];  Service: Orthopedics;  Laterality: Left;  greater tuberosity  . Shoulder open rotator cuff repair 07/21/2011    Procedure: ROTATOR CUFF REPAIR SHOULDER OPEN;  Surgeon: Fuller Canada, MD;  Location: [facility];  Service: Orthopedics;  Laterality: Left;    Family History  Problem Relation Age of  Onset  . Cancer    . Diabetes    . Anesthesia problems Neg Hx   . Hypotension Neg Hx   . Malignant hyperthermia Neg Hx   . Pseudochol deficiency Neg Hx     History  Substance Use Topics  . Smoking status: Never Smoker   . Smokeless tobacco: Not on file  . Alcohol Use: No    OB History    Grav Para Term Preterm Abortions TAB SAB Ect Mult Living   4 4 4              Review of Systems 10 Systems reviewed and are negative for acute change except as noted in the HPI.  Allergies  Review of patient's allergies indicates no known allergies.  Home Medications   Current Outpatient Rx  Name Route Sig Dispense Refill  . IBUPROFEN 800 MG PO TABS Oral Take 800 mg by mouth every 8 (eight) hours as needed. For pain    . OXYCODONE HCL 15 MG PO TABS Oral Take 15 mg by mouth every 4 (four) hours as needed.    Marland Kitchen MOXIFLOXACIN HCL 400 MG PO TABS Oral Take 1 tablet (400 mg total) by mouth daily. 10 tablet 0  . ONDANSETRON HCL 4 MG PO TABS Oral Take 1 tablet (4 mg total) by mouth every 6 (six) hours. 12 tablet 0    BP 132/47  Pulse 85  Temp(Src) 99.4 F (37.4 C) (Oral)  Resp 18  Ht 5\' 8"  (1.727 m)  Wt 234 lb (106.142 kg)  BMI 35.58  kg/m2  SpO2 100% Vitals reviewed Physical Exam  Nursing note and vitals reviewed. Constitutional: She is oriented to person, place, and time. She appears well-developed and well-nourished. No distress.       Tired appearing  HENT:  Head: Normocephalic and atraumatic.  Eyes: EOM are normal. Pupils are equal, round, and reactive to light.  Neck: Neck supple. No tracheal deviation present.  Cardiovascular: Normal rate.   Pulmonary/Chest: Effort normal. No respiratory distress. She has wheezes (mild expiratory ).       Frequent cough  Abdominal: Soft. She exhibits no distension.  Musculoskeletal: Normal range of motion. She exhibits no edema.       No swelling of the lower extremities   Neurological: She is alert and oriented to person, place, and time. No  sensory deficit.  Skin: Skin is warm and dry.  Psychiatric: She has a normal mood and affect. Her behavior is normal.    ED Course  Procedures (including critical care time) DIAGNOSTIC STUDIES: Oxygen Saturation is 98% on room air, normal by my interpretation.    COORDINATION OF CARE: 2:39PM Patient informed of current plan for treatment and evaluation and agrees with plan at this time.       Labs Reviewed  CBC - Abnormal; Notable for the following:    Hemoglobin 11.2 (*)    HCT 35.0 (*)    MCV 74.9 (*)    MCH 24.0 (*)    All other components within normal limits  COMPREHENSIVE METABOLIC PANEL - Abnormal; Notable for the following:    Sodium 132 (*)    Chloride 95 (*)    GFR calc non Af Amer 73 (*)    GFR calc Af Amer 85 (*)    All other components within normal limits  URINALYSIS, ROUTINE W REFLEX MICROSCOPIC - Abnormal; Notable for the following:    Specific Gravity, Urine <1.005 (*)    Ketones, ur 15 (*)    Leukocytes, UA SMALL (*)    All other components within normal limits  URINE MICROSCOPIC-ADD ON - Abnormal; Notable for the following:    Squamous Epithelial / LPF FEW (*)    Bacteria, UA FEW (*)    All other components within normal limits   Dg Chest 2 View  09/12/2011  *RADIOLOGY REPORT*  Clinical Data: Headache, vomiting.  CHEST - 2 VIEW  Comparison: 06/30/2010  Findings: Heart is normal size.  Linear scarring or atelectasis in the left base.  Mild peribronchial thickening.  No focal opacity on the right.  No effusions.  No acute bony abnormality.  IMPRESSION: Bronchitic changes.  Left basilar scarring or atelectasis.  Original Report Authenticated By: Cyndie Chime, M.D.     1. Bronchitis   2. Fever       MDM  Patient presenting with multiple complaints including cough headache sore throat generalized fatigue and low-grade fever. Her chest x-ray was consistent with bronchitic changes and there were no acute infiltrates. Her labs were also reassuring with  the exception of mild hyponatremia. She route seat IV normal saline in the emergency department. She has anemia which is at her baseline. Her urine does not reveal any infection or signs of dehydration. She was treated with anti-medics Toradol IV fluids and albuterol for cough and bronchitis. She tolerated fluid challenge in the ED and was discharged home with an albuterol inhaler, nausea medication, as well as antibiotics. She was given strict return precautions and advised to arrange for followup this week with her primary care  Dr. She was agreeable with this plan.     I personally performed the services described in this documentation, which was scribed in my presence. The recorded information has been reviewed and considered.    Ethelda Chick, MD 09/12/11 2028

## 2011-09-14 ENCOUNTER — Ambulatory Visit: Payer: Self-pay | Admitting: Orthopedic Surgery

## 2011-09-20 ENCOUNTER — Telehealth: Payer: Self-pay | Admitting: Orthopedic Surgery

## 2011-09-20 NOTE — Telephone Encounter (Signed)
Patient called back today and re-scheduled post op appointment from 09/14/11, which she had cancelled due to illness.  Scheduled for first available next week, when Dr. Romeo Apple returns to office.

## 2011-09-25 ENCOUNTER — Telehealth (HOSPITAL_COMMUNITY): Payer: Self-pay

## 2011-09-25 ENCOUNTER — Inpatient Hospital Stay (HOSPITAL_COMMUNITY): Admission: RE | Admit: 2011-09-25 | Payer: Self-pay | Source: Ambulatory Visit | Admitting: Physical Therapy

## 2011-09-27 ENCOUNTER — Encounter: Payer: Self-pay | Admitting: Orthopedic Surgery

## 2011-09-27 ENCOUNTER — Ambulatory Visit (INDEPENDENT_AMBULATORY_CARE_PROVIDER_SITE_OTHER): Payer: Self-pay | Admitting: Orthopedic Surgery

## 2011-09-27 VITALS — BP 110/78 | Ht 68.0 in | Wt 234.0 lb

## 2011-09-27 DIAGNOSIS — S42253A Displaced fracture of greater tuberosity of unspecified humerus, initial encounter for closed fracture: Secondary | ICD-10-CM

## 2011-09-27 NOTE — Progress Notes (Signed)
Patient ID: Stacy Ashley, female   DOB: 11-20-1945, 66 y.o.   MRN: [id number] Chief Complaint  Patient presents with  . Routine Post Op     recheck left gr tub fracture otif feb 1st      Passive range of motion today is 120. She is complaining of some pain in her biceps area. He has 45 of external rotation without pain or discomfort.  She was unable to go to therapy last week secondary to illness.  Recommend resuming physical therapy for 12 visits in followup in a month and then reassess. I think at this point is just a strength issue is her passive range of motion is pretty good. Continue to improve over time. When she comes back we should take an x-ray of her shoulder to make sure her screw is intact

## 2011-09-27 NOTE — Patient Instructions (Signed)
Continue therapy   Ibuprofen as needed

## 2011-10-17 ENCOUNTER — Ambulatory Visit (HOSPITAL_COMMUNITY)
Admission: RE | Admit: 2011-10-17 | Discharge: 2011-10-17 | Disposition: A | Discharge status: Home / Self Care | Payer: Self-pay | Source: Ambulatory Visit | Attending: Orthopedic Surgery | Admitting: Orthopedic Surgery

## 2011-10-17 DIAGNOSIS — S4290XA Fracture of unspecified shoulder girdle, part unspecified, initial encounter for closed fracture: Secondary | ICD-10-CM

## 2011-10-17 DIAGNOSIS — R29898 Other symptoms and signs involving the musculoskeletal system: Secondary | ICD-10-CM

## 2011-10-17 DIAGNOSIS — M25519 Pain in unspecified shoulder: Secondary | ICD-10-CM | POA: Insufficient documentation

## 2011-10-17 DIAGNOSIS — M25629 Stiffness of unspecified elbow, not elsewhere classified: Secondary | ICD-10-CM

## 2011-10-17 DIAGNOSIS — M25619 Stiffness of unspecified shoulder, not elsewhere classified: Secondary | ICD-10-CM | POA: Insufficient documentation

## 2011-10-17 DIAGNOSIS — IMO0001 Reserved for inherently not codable concepts without codable children: Secondary | ICD-10-CM | POA: Insufficient documentation

## 2011-10-17 DIAGNOSIS — M6281 Muscle weakness (generalized): Secondary | ICD-10-CM | POA: Insufficient documentation

## 2011-10-17 DIAGNOSIS — S42253A Displaced fracture of greater tuberosity of unspecified humerus, initial encounter for closed fracture: Secondary | ICD-10-CM

## 2011-10-17 NOTE — Evaluation (Signed)
Occupational Therapy Evaluation  Patient Details  Name: Stacy Ashley MRN: [id number] Date of Birth: 24-Jun-1945  Today's Date: 10/17/2011 Time: 0900-0930 Time Calculation (min): 30 min OT Evaluation 900-915 15' Manual Therapy 915-925 10'  Visit#: 1  of 12   Re-eval: 11/14/11  Assessment Diagnosis: S/P Left Humerus Greater Tuberosity Fracture Prior Therapy: PT x 4 weeks for same diagnosis  Past Medical History:  Past Medical History  Diagnosis Date  . Carpal tunnel syndrome   . Complication of anesthesia 1985    pt sts bp dropped "really low" and they had trouble bringing me back  . Myocardial infarction 08-2009   Past Surgical History:  Past Surgical History  Procedure Date  . Cesarean section     x3  . Lymphectomy right cervical  . Tubal ligation   . Lipoma excision 12/2007    bilateral ankles  . Septoplasty 04/2010  . Orif shoulder fracture 07/21/2011    Procedure: OPEN REDUCTION INTERNAL FIXATION (ORIF) SHOULDER FRACTURE;  Surgeon: Stacy Canada, MD;  Location: [facility];  Service: Orthopedics;  Laterality: Left;  greater tuberosity  . Shoulder open rotator cuff repair 07/21/2011    Procedure: ROTATOR CUFF REPAIR SHOULDER OPEN;  Surgeon: Stacy Canada, MD;  Location: [facility];  Service: Orthopedics;  Laterality: Left;    Subjective Symptoms/Limitations Symptoms: S:  I went to Dr. Romeo Ashley, and he wanted me to have some more therapy.  I want to be able to reach over my head, and Icant do that yet. Limitations: Stacy Ashley fell at church and fractured the greater tuberosity of her left humerus.  She also had rotator cuff repair surgery on her left shoulder in February.  She had been attending PT for her left shoulder, but was discharged on 09/05/11.  She has been  referred to occupational therapy for evaluation and treatment. Pain Assessment Currently in Pain?: No/denies Pain Score: 0-No pain  Precautions/Restrictions   N/A  Prior Functioning  Home Living Lives  With: Alone Additional Comments: has a 64 year old foster child Prior Function Comments: Is a full time student, will be going back to school in August.  She enjoys working out  Assessment ADL/Vision/Perception ADL ADL Comments: Patient is right hand dominant.  She is not able to use her left arm to reach over her head or lift anything heavy.    Cognition/Observation Cognition Overall Cognitive Status: Appears within functional limits for tasks assessed  Sensation/Coordination/Edema Sensation Light Touch: Appears Intact Coordination Gross Motor Movements are Fluid and Coordinated: Yes Fine Motor Movements are Fluid and Coordinated: Yes  Additional Assessments LUE AROM (degrees) LUE Overall AROM Comments: assessed in seated.  ER and IR with shoulder adducted Left Shoulder Flexion  0-170: 125 Degrees Left Shoulder ABduction 0-170: 130 Degrees Left Shoulder Internal Rotation  0-90: 90 Degrees Left Shoulder External Rotation  0-90: 68 Degrees LUE Strength Left Shoulder Flexion: 3+/5 Left Shoulder ABduction: 3+/5 Left Shoulder Internal Rotation:  (4-/5) Left Shoulder External Rotation:  (4+/5) Palpation Palpation: Minimal fascial restrictions in left shoulder region.  Full PROM in supine achieved this date.     Exercise/Treatments Supine Protraction: PROM;5 reps Horizontal ABduction: PROM;5 reps External Rotation: PROM;5 reps Internal Rotation: PROM;5 reps Flexion: PROM;5 reps ABduction: PROM;5 reps    Manual Therapy Manual Therapy: Myofascial release Myofascial Release: MFR and manual stretching to left upper arm, shoulder, and scapular region to decrease pain and restrictions and increase mobility.  Occupational Therapy Assessment and Plan OT Assessment and Plan Clinical Impression Statement: Patient with  decreased use of LUE due to: Pt will benefit from skilled therapeutic intervention in order to improve on the following deficits: Decreased strength;Decreased range  of motion;Increased fascial restricitons;Impaired UE functional use Rehab Potential: Excellent OT Frequency: Min 2X/week OT Duration: 6 weeks OT Treatment/Interventions: Therapeutic exercise;Manual therapy;Modalities;Patient/family education OT Plan: P:  Skilled OT intervention 2 times a week to increase AROM and strength and decrease fascial restrictions.  Treatment Plan:  MFR and manual therapy.  Supine strengthening, seated AROM, scapular strengthening, ball circles, wall wash, cybex press and row, and UBE.   Goals Short Term Goals Time to Complete Short Term Goals: 3 weeks Short Term Goal 1: Patient will be educated on a HEP. Short Term Goal 2: Patient will increase AROM in left shoulder flexion and abduction by 10 for increased ability to reach into overhead cabinets. Short Term Goal 3: Patient will increase left shoulder strength to 4/5 for increased ability to pick up her foster child. Short Term Goal 4: Patient will decrease fascial restrictions to minimal in her left shoulder region. Long Term Goals Time to Complete Long Term Goals: 6 weeks Long Term Goal 1: Patient will return to her prior level of I with all B/IADLs, work, leisure activities. Long Term Goal 2: Patient will increase left shoulder flexion and abduction to 155 or better for increased ability to reach into overhead cabinets. Long Term Goal 3: Patient will increase left shoulder strength to 4+/5 for increased ability to lift bags of groceries. Long Term Goal 4: Patient will decrease fascial restrictions in her left shoulder to trace.  Problem List Patient Active Problem List  Diagnoses  . 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 MORBID OBESITY  . CELLULITIS, LEG, RIGHT  . HIDRADENITIS  . CLOSED FRACTURE OF BASE OF OTHER METACARPAL BONE  . Pain in wrist joint  . Muscle weakness (generalized)  . Fracture dislocation of shoulder joint  . Greater tuberosity of humerus fracture  . Stiffness of upper arm joint   . Weakness of left arm    End of Session Activity Tolerance: Patient tolerated treatment well General Behavior During Session: [facility] for tasks performed Cognition: [facility] for tasks performed OT Plan of Care OT Home Exercise Plan: Educated patient and issued instructions for shoulder AROM and stretches. Consulted and Agree with Plan of Care: Patient  GO  Functional Reporting Modifier  Current Status  [phone] - Carrying, Moving and Handling Objects CJ - At least 20% but less than 40% impaired, limited or restricted  Goal Status  [phone] - Carrying, Moving and Handling Objects CI - At least 1% but less than 20% impaired, limited or restricted   Shirlean Mylar, OTR/L  10/17/2011, 11:43 AM  Physician Documentation Your signature is required to indicate approval of the treatment plan as stated above.  Please sign and either send electronically or make a copy of this report for your files and return this physician signed original.  Please mark one 1.__approve of plan  2. ___approve of plan with the following conditions.   ______________________________                                                          _____________________ Physician Signature  Date  

## 2011-11-01 ENCOUNTER — Inpatient Hospital Stay (HOSPITAL_COMMUNITY): Admission: RE | Admit: 2011-11-01 | Payer: Self-pay | Source: Ambulatory Visit | Admitting: Occupational Therapy

## 2011-11-02 ENCOUNTER — Ambulatory Visit: Payer: Self-pay | Admitting: Orthopedic Surgery

## 2011-11-03 ENCOUNTER — Ambulatory Visit (HOSPITAL_COMMUNITY): Payer: Self-pay | Admitting: Occupational Therapy

## 2011-11-10 ENCOUNTER — Telehealth (HOSPITAL_COMMUNITY): Payer: Self-pay | Admitting: Specialist

## 2011-11-10 ENCOUNTER — Ambulatory Visit (HOSPITAL_COMMUNITY): Payer: Self-pay | Admitting: Specialist

## 2011-11-13 ENCOUNTER — Emergency Department (HOSPITAL_COMMUNITY)
Admission: EM | Admit: 2011-11-13 | Discharge: 2011-11-13 | Disposition: A | Discharge status: Home / Self Care | Payer: Self-pay | Attending: Emergency Medicine | Admitting: Emergency Medicine

## 2011-11-13 ENCOUNTER — Encounter (HOSPITAL_COMMUNITY): Payer: Self-pay

## 2011-11-13 DIAGNOSIS — R21 Rash and other nonspecific skin eruption: Secondary | ICD-10-CM | POA: Insufficient documentation

## 2011-11-13 DIAGNOSIS — T7840XA Allergy, unspecified, initial encounter: Secondary | ICD-10-CM

## 2011-11-13 DIAGNOSIS — T360X5A Adverse effect of penicillins, initial encounter: Secondary | ICD-10-CM | POA: Insufficient documentation

## 2011-11-13 DIAGNOSIS — I252 Old myocardial infarction: Secondary | ICD-10-CM | POA: Insufficient documentation

## 2011-11-13 MED ORDER — DIPHENHYDRAMINE HCL 50 MG/ML IJ SOLN
25.0000 mg | Freq: Once | INTRAMUSCULAR | Status: AC
  Administered 2011-11-13: 25 mg via INTRAVENOUS
  Filled 2011-11-13: qty 1

## 2011-11-13 MED ORDER — PREDNISONE 20 MG PO TABS: ORAL_TABLET | ORAL | Status: AC

## 2011-11-13 MED ORDER — METHYLPREDNISOLONE SODIUM SUCC 125 MG IJ SOLR
125.0000 mg | Freq: Once | INTRAMUSCULAR | Status: AC
  Administered 2011-11-13: 125 mg via INTRAVENOUS
  Filled 2011-11-13: qty 2

## 2011-11-13 MED ORDER — CLINDAMYCIN HCL 300 MG PO CAPS: 300.0000 mg | ORAL_CAPSULE | Freq: Three times a day (TID) | ORAL | Status: AC

## 2011-11-13 MED ORDER — FAMOTIDINE IN NACL 20-0.9 MG/50ML-% IV SOLN
20.0000 mg | Freq: Once | INTRAVENOUS | Status: AC
Start: 2011-11-13 — End: 2011-11-13
  Administered 2011-11-13: 20 mg via INTRAVENOUS
  Filled 2011-11-13: qty 50

## 2011-11-13 NOTE — ED Notes (Signed)
Patient reporting allergic reaction to amox/k clav medication as evidenced by rash to bilateral arms and back; patient reporting shortness of breath and facial numbness beginning today. Patient has had a history of this reaction in the past. Patient ambulatory in department without distress.

## 2011-11-13 NOTE — ED Provider Notes (Signed)
History     CSN: [id number]  Arrival date & time 11/13/11  4098   First MD Initiated Contact with Patient 11/13/11 1010      Chief Complaint  Patient presents with  . Allergic Reaction    (Consider location/radiation/quality/duration/timing/severity/associated sxs/prior treatment) Patient is a 66 y.o. female presenting with allergic reaction. The history is provided by the patient.  Allergic Reaction The primary symptoms are  rash. The primary symptoms do not include wheezing, shortness of breath, cough or abdominal pain.  Significant symptoms that are not present include eye redness.  pt c/o allergic rxn to augmentin. States started as couple days ago, developed diffuse pruritus and rash to bil arms. States throat feels slightly swollen although no difficulty breathing or swallowing. No faintness or syncope. No sob or wheezing. States similar rxn to augmentin in past. No other recent new meds. No change in home or personal products. No change in diet. No fever or chills. States augmentin was for sinusitis recently diagnosed  By her ent. States had outpt ct, had sinus pain/congestion. No headache. No neck pain or stiffness.   Past Medical History  Diagnosis Date  . Carpal tunnel syndrome   . Complication of anesthesia 1985    pt sts bp dropped "really low" and they had trouble bringing me back  . Myocardial infarction 08-2009    Past Surgical History  Procedure Date  . Cesarean section     x3  . Lymphectomy right cervical  . Tubal ligation   . Lipoma excision 12/2007    bilateral ankles  . Septoplasty 04/2010  . Orif shoulder fracture 07/21/2011    Procedure: OPEN REDUCTION INTERNAL FIXATION (ORIF) SHOULDER FRACTURE;  Surgeon: Fuller Canada, MD;  Location: [facility];  Service: Orthopedics;  Laterality: Left;  greater tuberosity  . Shoulder open rotator cuff repair 07/21/2011    Procedure: ROTATOR CUFF REPAIR SHOULDER OPEN;  Surgeon: Fuller Canada, MD;  Location: [facility];   Service: Orthopedics;  Laterality: Left;    Family History  Problem Relation Age of Onset  . Cancer    . Diabetes    . Anesthesia problems Neg Hx   . Hypotension Neg Hx   . Malignant hyperthermia Neg Hx   . Pseudochol deficiency Neg Hx     History  Substance Use Topics  . Smoking status: Never Smoker   . Smokeless tobacco: Not on file  . Alcohol Use: No    OB History    Grav Para Term Preterm Abortions TAB SAB Ect Mult Living   4 4 4              Review of Systems  Constitutional: Negative for fever.  HENT: Negative for neck pain and neck stiffness.   Eyes: Negative for redness.  Respiratory: Negative for cough, shortness of breath, wheezing and stridor.   Cardiovascular: Negative for chest pain.  Gastrointestinal: Negative for abdominal pain.  Genitourinary: Negative for flank pain.  Musculoskeletal: Negative for back pain.  Skin: Positive for rash.  Neurological: Negative for headaches.  Hematological: Does not bruise/bleed easily.  Psychiatric/Behavioral: Negative for confusion.    Allergies  Amoxicillin-pot clavulanate  Home Medications   Current Outpatient Rx  Name Route Sig Dispense Refill  . AMOXICILLIN-POT CLAVULANATE ER PO Oral Take 1 tablet by mouth 2 (two) times daily. Started on 24 may 13    . IBUPROFEN 800 MG PO TABS Oral Take 800 mg by mouth every 8 (eight) hours as needed. For pain    . OXYCODONE  HCL 15 MG PO TABS Oral Take 15 mg by mouth every 4 (four) hours as needed. For pain      BP 155/72  Pulse 87  Temp(Src) 97.5 F (36.4 C) (Oral)  Resp 18  SpO2 100%  Physical Exam  Nursing note and vitals reviewed. Constitutional: She is oriented to person, place, and time. She appears well-developed and well-nourished. No distress.  HENT:  Nose: Nose normal.  Mouth/Throat: Oropharynx is clear and moist.  Eyes: Conjunctivae are normal. No scleral icterus.  Neck: Neck supple. No tracheal deviation present.  Cardiovascular: Normal rate, regular  rhythm, normal heart sounds and intact distal pulses.   Pulmonary/Chest: Effort normal and breath sounds normal. No respiratory distress. She has no wheezes.  Abdominal: Normal appearance. She exhibits no distension.  Musculoskeletal: She exhibits no edema.  Neurological: She is alert and oriented to person, place, and time.       Steady gait.   Skin: Skin is warm and dry. No rash noted.       Sl erythematous, scaly, papular rash to bil upper extremities. No palms or soles, no mm involvement. No urticaria/hives. No petechia. No desquamation.   Psychiatric: She has a normal mood and affect.    ED Course  Procedures (including critical care time)     MDM  Iv benadryl, solumedrol and pepcid.   Pt states she thinks she had similar rxn to augmentin in past. Will stop.    Recheck no throat swelling or trouble breathing or swallowing.  Pt improved. No pruritus.   Pts ENT, Dr Pollyann Kennedy saw pt in ed, he states stop augmentin, give rx clinda, he will follow up.      Suzi Roots, MD 11/13/11 1239

## 2011-11-13 NOTE — Discharge Instructions (Signed)
Take prednisone as prescribed. Take benadryl as need for itching - may make drowsy, no driving for the next 4 hours or when taking benadryl. Keep skin clean/dry, avoid any perfumed soaps or detergents.  Stop the augmentin - inform your doctors in future of your possible allergic reaction to this medication. Take new antibiotic as prescribed.  Follow up with Dr Pollyann Kennedy as discussed with him. Your blood pressure is mildly high today -  Follow up with primary care doctor in the next 1-2 weeks.  Return to ER if worse, new symptoms, severe itching, trouble breathing, throat swelling, other concern.       Allergic Reaction, Mild to Moderate Allergies may happen from anything your body is sensitive to. This may be food, medications, pollens, chemicals, and nearly anything around you in everyday life that produces allergens. An allergen is anything that causes an allergy producing substance. Allergens cause your body to release allergic antibodies. Through a chain of events, they cause a release of histamine into the blood stream. Histamines are meant to protect you, but they also cause your discomfort. This is why antihistamines are often used for allergies. Heredity is often a factor in causing allergic reactions. This means you may have some of the same allergies as your parents. Allergies happen in all age groups. You may have some idea of what caused your reaction. There are many allergens around [place]. It may be difficult to know what caused your reaction. If this is a first time event, it may never happen again. Allergies cannot be cured but can be controlled with medications. SYMPTOMS  You may get some or all of the following problems from allergies.  Swelling and itching in and around the mouth.   Tearing, itchy eyes.   Nasal congestion and runny nose.   Sneezing and coughing.   An itchy red rash or hives.   Vomiting or diarrhea.   Difficulty breathing.  Seasonal allergies occur in all age  groups. They are seasonal because they usually occur during the same season every year. They may be a reaction to molds, grass pollens, or tree pollens. Other causes of allergies are house dust mite allergens, pet dander and mold spores. These are just a common few of the thousands of allergens around [place]. All of the symptoms listed above happen when you come in contact with pollens and other allergens. Seasonal allergies are usually not life threatening. They are generally more of a nuisance that can often be handled using medications. Hay fever is a combination of all or some of the above listed allergy problems. It may often be treated with simple over-the-counter medications such as diphenhydramine. Take medication as directed. Check with your caregiver or package insert for child dosages. TREATMENT AND HOME CARE INSTRUCTIONS If hives or rash are present:  Take medications as directed.   You may use an over-the-counter antihistamine (diphenhydramine) for hives and itching as needed. Do not drive or drink alcohol until medications used to treat the reaction have worn off. Antihistamines tend to make people sleepy.   Apply cold cloths (compresses) to the skin or take baths in cool water. This will help itching. Avoid hot baths or showers. Heat will make a rash and itching worse.   If your allergies persist and become more severe, and over the counter medications are not effective, there are many new medications your caretaker can prescribe. Immunotherapy or desensitizing injections can be used if all else fails. Follow up with your caregiver if problems continue.  SEEK MEDICAL CARE IF:   Your allergies are becoming progressively more troublesome.   You suspect a food allergy. Symptoms generally happen within 30 minutes of eating a food.   Your symptoms have not gone away within 2 days or are getting worse.   You develop new symptoms.   You want to retest yourself or your child with a food or  drink you think causes an allergic reaction. Never test yourself or your child of a suspected allergy without being under the watchful eye of your caregivers. A second exposure to an allergen may be life-threatening.  SEEK IMMEDIATE MEDICAL CARE IF:  You develop difficulty breathing or wheezing, or have a tight feeling in your chest or throat.   You develop a swollen mouth, hives, swelling, or itching all over your body.  A severe reaction with any of the above problems should be considered life-threatening. If you suddenly develop difficulty breathing call for local emergency medical help. THIS IS AN EMERGENCY. MAKE SURE YOU:   Understand these instructions.   Will watch your condition.   Will get help right away if you are not doing well or get worse.  Document Released: 04/02/2007 Document Revised: 05/25/2011 Document Reviewed: 04/02/2007 [facility] Patient Information 2012 [place], [place].

## 2011-11-13 NOTE — Consult Note (Signed)
  The patient called me earlier today and I recommended she come to the emergency department. To review, she had sinus surgery at [facility] several years ago. In 2011, she was admitted with lymphadenopathy and neurologic symptoms. 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 She underwent successful sphenoid sinus surgery and has done well since then. She came in on Friday complaining of numbness in her face and new lymph node swelling. We did a CT at that time which revealed wide open maxillary sinuses but large fluid levels, and the sphenoid sinus thickening. I started her on Augmentin.  She came in today complaining of rash and swelling of her arm and throat. She has tingling of the right side of the face and lateral upper tooth aching. She has received some medication in the emergency department and feels that the swelling has gone way down. She still has some palpable lymph nodes on the left but slightly better than they were on Friday. Facial, nasal exam all clear.  Recommend change the antibiotic clindamycin 300 mg 3 times a day. She is likely allergic to the amoxicillin. She will keep her follow up with me in 2-3 weeks. She'll contact me if she gets any worse.

## 2011-11-22 ENCOUNTER — Ambulatory Visit (INDEPENDENT_AMBULATORY_CARE_PROVIDER_SITE_OTHER): Payer: Self-pay | Admitting: Orthopedic Surgery

## 2011-11-22 ENCOUNTER — Encounter: Payer: Self-pay | Admitting: Orthopedic Surgery

## 2011-11-22 VITALS — BP 124/90 | Ht 68.0 in | Wt 234.0 lb

## 2011-11-22 DIAGNOSIS — S42253A Displaced fracture of greater tuberosity of unspecified humerus, initial encounter for closed fracture: Secondary | ICD-10-CM

## 2011-11-22 NOTE — Patient Instructions (Signed)
activities as tolerated 

## 2011-11-22 NOTE — Progress Notes (Signed)
Patient ID: Stacy Ashley, female   DOB: [birth year], 66 y.o.   MRN: [id number] Chief Complaint  Patient presents with  . Follow-up    recheck left shoulder following PT    BP 124/90  Ht 5\' 8"  (1.727 m)  Wt 234 lb (106.142 kg)  BMI 35.58 kg/m2  Postop from February open treatment internal fixation left greater tuberosity fracture.  She has full range of motion no complaints full-strength has returned  Follow up as needed activities as tolerated

## 2012-04-03 ENCOUNTER — Encounter (HOSPITAL_COMMUNITY): Payer: Self-pay | Admitting: *Deleted

## 2012-04-03 ENCOUNTER — Emergency Department (HOSPITAL_COMMUNITY): Payer: Self-pay

## 2012-04-03 ENCOUNTER — Emergency Department (HOSPITAL_COMMUNITY)
Admission: EM | Admit: 2012-04-03 | Discharge: 2012-04-03 | Disposition: A | Discharge status: Home / Self Care | Payer: Self-pay | Attending: Emergency Medicine | Admitting: Emergency Medicine

## 2012-04-03 DIAGNOSIS — M79609 Pain in unspecified limb: Secondary | ICD-10-CM | POA: Insufficient documentation

## 2012-04-03 DIAGNOSIS — R51 Headache: Secondary | ICD-10-CM | POA: Insufficient documentation

## 2012-04-03 DIAGNOSIS — R0602 Shortness of breath: Secondary | ICD-10-CM | POA: Insufficient documentation

## 2012-04-03 DIAGNOSIS — R609 Edema, unspecified: Secondary | ICD-10-CM | POA: Insufficient documentation

## 2012-04-03 DIAGNOSIS — M7989 Other specified soft tissue disorders: Secondary | ICD-10-CM | POA: Insufficient documentation

## 2012-04-03 DIAGNOSIS — I252 Old myocardial infarction: Secondary | ICD-10-CM | POA: Insufficient documentation

## 2012-04-03 LAB — TROPONIN I: Troponin I: <0.3 ng/mL (ref <0.30)

## 2012-04-03 LAB — COMPREHENSIVE METABOLIC PANEL
ALT: 21 U/L (ref 0–35)
AST: 17 U/L (ref 0–37)
Albumin: 3.4 g/dL — ABNORMAL LOW (ref 3.5–5.2)
Alkaline Phosphatase: 123 U/L — ABNORMAL HIGH (ref 39–117)
CO2: 27 mEq/L (ref 19–32)
Chloride: 106 mEq/L (ref 96–112)
Creatinine, Ser: 0.76 mg/dL (ref 0.50–1.10)
GFR calc non Af Amer: 87 mL/min — ABNORMAL LOW (ref >90)
Potassium: 3.7 mEq/L (ref 3.5–5.1)
Total Bilirubin: 0.3 mg/dL (ref 0.3–1.2)

## 2012-04-03 LAB — CBC WITH DIFFERENTIAL/PLATELET
Basophils Relative: 1 % (ref 0–1)
Eosinophils Absolute: 0.4 10*3/uL (ref 0.0–0.7)
Eosinophils Relative: 4 % (ref 0–5)
HCT: 33.7 % — ABNORMAL LOW (ref 36.0–46.0)
Hemoglobin: 11 g/dL — ABNORMAL LOW (ref 12.0–15.0)
Lymphocytes Relative: 20 % (ref 12–46)
MCH: 24.1 pg — ABNORMAL LOW (ref 26.0–34.0)
MCHC: 32.6 g/dL (ref 30.0–36.0)
Neutro Abs: 6.2 10*3/uL (ref 1.7–7.7)
RBC: 4.56 MIL/uL (ref 3.87–5.11)

## 2012-04-03 LAB — PRO B NATRIURETIC PEPTIDE: Pro B Natriuretic peptide (BNP): 625.9 pg/mL — ABNORMAL HIGH (ref 0–125)

## 2012-04-03 MED ORDER — IOHEXOL 350 MG/ML SOLN
100.0000 mL | Freq: Once | INTRAVENOUS | Status: AC | PRN
  Administered 2012-04-03: 100 mL via INTRAVENOUS

## 2012-04-03 MED ORDER — HYDROCHLOROTHIAZIDE 25 MG PO TABS: 25.0000 mg | ORAL_TABLET | Freq: Every day | ORAL | Status: DC | PRN

## 2012-04-03 MED ORDER — POTASSIUM CHLORIDE ER 10 MEQ PO TBCR: EXTENDED_RELEASE_TABLET | ORAL | Status: DC

## 2012-04-03 NOTE — ED Notes (Signed)
Pt alert & oriented x4, stable gait. Patient given discharge instructions, paperwork & prescription(s). Patient  instructed to stop at the registration desk to finish any additional paperwork. Patient verbalized understanding. Pt left department w/ no further questions. 

## 2012-04-03 NOTE — ED Provider Notes (Cosign Needed)
History    This chart was scribed for Ward Givens, MD, MD by Smitty Pluck. The patient was seen in room APA08 and the patient's care was started at 4:34PM.   CSN: [id number]  Arrival date & time 04/03/12  1546       Chief Complaint  Patient presents with  . Leg Swelling  . Shortness of Breath    (Consider location/radiation/quality/duration/timing/severity/associated sxs/prior treatment) Patient is a 66 y.o. female presenting with shortness of breath. The history is provided by the patient. No language interpreter was used.  Shortness of Breath  Associated symptoms include shortness of breath.   Stacy Ashley is a 66 y.o. female who presents to the Emergency Department complaining of constant, moderate bilateral leg swelling into her thighs and intermittent, shooting bilateral leg pain onset 1 day ago. Pt reports that her legs feel tight and that she has pain in her legs when she sits down. She has had swelling in her ankles in the past but not as severe as current symptoms. She reports that she normally weighs 240 but this morning she weighed 260. She reports having SOB that is aggravated by walking  Denies chest pain, fevers and abdominal pain, distention, or bloating. She reports having moderate headache this AM when she awoke that is better now. Pt reports she is working and going to school and has clinicals starting in September.   Pt denies taking medications, hx of HTN or CHF, smoking and drinking. Pt reports that her BP usually runs 120/70. Pt reports having tubal ligation and lipoma excision.   Reports family hx of diabetes, cancer Pt does to [facility] for medical treatment   Past Medical History  Diagnosis Date  . Carpal tunnel syndrome   . Complication of anesthesia 1985    pt sts bp dropped "really low" and they had trouble bringing me back  . Myocardial infarction 08-2009    Past Surgical History  Procedure Date  . Cesarean section     x3  . Lymphectomy  right cervical  . Tubal ligation   . Lipoma excision 12/2007    bilateral ankles  . Septoplasty 04/2010  . Orif shoulder fracture 07/21/2011    Procedure: OPEN REDUCTION INTERNAL FIXATION (ORIF) SHOULDER FRACTURE;  Surgeon: Fuller Canada, MD;  Location: [facility];  Service: Orthopedics;  Laterality: Left;  greater tuberosity  . Shoulder open rotator cuff repair 07/21/2011    Procedure: ROTATOR CUFF REPAIR SHOULDER OPEN;  Surgeon: Fuller Canada, MD;  Location: [facility];  Service: Orthopedics;  Laterality: Left;    Family History  Problem Relation Age of Onset  . Cancer    . Diabetes    . Anesthesia problems Neg Hx   . Hypotension Neg Hx   . Malignant hyperthermia Neg Hx   . Pseudochol deficiency Neg Hx   No CAD, CHF FOP heart murmer  History  Substance Use Topics  . Smoking status: Never Smoker   . Smokeless tobacco: Not on file  . 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   Neurological: Positive for headaches.  All other systems reviewed and are negative.    Allergies  Bee venom and Amoxicillin-pot clavulanate  Home Medications  none   BP 149/67  Pulse 67  Temp 98.1 F (36.7 C)  Resp 20  Ht 5\' 8"  (1.727 m)  Wt 260 lb (117.935 kg)  BMI 39.53 kg/m2  SpO2 100%  Vital signs normal    Physical Exam  Nursing note and vitals reviewed. Constitutional: She is oriented to person, place, and time. She appears well-developed and well-nourished.  Non-toxic appearance. She does not appear ill. No distress.  HENT:  Head: Normocephalic and atraumatic.  Right Ear: External ear normal.  Left Ear: External ear normal.  Nose: Nose normal. No mucosal edema or rhinorrhea.  Mouth/Throat: Oropharynx is clear and moist and mucous membranes are normal. No dental abscesses or uvula swelling.  Eyes: Conjunctivae normal and EOM  are normal. Pupils are equal, round, and reactive to light.  Neck: Normal range of motion and full passive range of motion without pain. Neck supple.  Cardiovascular: Normal rate, regular rhythm and normal heart sounds.  Exam reveals no gallop and no friction rub.   No murmur heard. Pulmonary/Chest: Effort normal. No respiratory distress. She has no wheezes. She has no rhonchi. She has no rales. She exhibits no tenderness and no crepitus.       Diminished breath sounds   Abdominal: Soft. Normal appearance and bowel sounds are normal. She exhibits no distension. There is no tenderness. There is no rebound and no guarding.  Musculoskeletal: Normal range of motion. She exhibits no tenderness.       Moves all extremities well.  Bilateral lower extremities are diffusely swollen without pitting   Neurological: She is alert and oriented to person, place, and time. She has normal strength. No cranial nerve deficit.  Skin: Skin is warm, dry and intact. No rash noted. No erythema. No pallor.  Psychiatric: She has a normal mood and affect. Her speech is normal and behavior is normal. Her mood appears not anxious.    ED Course  Procedures (including critical care time) DIAGNOSTIC STUDIES: Oxygen Saturation is 100% on room air, normal by my interpretation.    COORDINATION OF CARE: 4:41 PM Discussed ED treatment with pt   6:07 PM Recheck. Discussed pt lab results with pt. Pt will have CT chest.  Results for orders placed during the hospital encounter of 04/03/12  CBC WITH DIFFERENTIAL      Component Value Range   WBC 9.3  4.0 - 10.5 K/uL   RBC 4.56  3.87 - 5.11 MIL/uL   Hemoglobin 11.0 (*) 12.0 - 15.0 g/dL   HCT 16.1 (*) 09.6 - 04.5 %   MCV 73.9 (*) 78.0 - 100.0 fL   MCH 24.1 (*) 26.0 - 34.0 pg   MCHC 32.6  30.0 - 36.0 g/dL   RDW 40.9  81.1 - 91.4 %   Platelets 231  150 - 400 K/uL   Neutrophils Relative 68  43 - 77 %   Lymphocytes Relative 20  12 - 46 %   Monocytes Relative 7  3 - 12 %    Eosinophils Relative 4  0 - 5 %   Basophils Relative 1  0 - 1 %   Neutro Abs 6.2  1.7 - 7.7 K/uL   Lymphs Abs 1.9  0.7 - 4.0 K/uL   Monocytes Absolute 0.7  0.1 - 1.0 K/uL   Eosinophils Absolute 0.4  0.0 - 0.7 K/uL   Basophils Absolute 0.1  0.0 - 0.1 K/uL   RBC Morphology POLYCHROMASIA PRESENT     WBC Morphology ATYPICAL LYMPHOCYTES     Smear Review LARGE PLATELETS PRESENT  COMPREHENSIVE METABOLIC PANEL      Component Value Range   Sodium 140  135 - 145 mEq/L   Potassium 3.7  3.5 - 5.1 mEq/L   Chloride 106  96 - 112 mEq/L   CO2 27  19 - 32 mEq/L   Glucose, Bld 78  70 - 99 mg/dL   BUN 13  6 - 23 mg/dL   Creatinine, Ser 1.61  0.50 - 1.10 mg/dL   Calcium 9.0  8.4 - 09.6 mg/dL   Total Protein 6.8  6.0 - 8.3 g/dL   Albumin 3.4 (*) 3.5 - 5.2 g/dL   AST 17  0 - 37 U/L   ALT 21  0 - 35 U/L   Alkaline Phosphatase 123 (*) 39 - 117 U/L   Total Bilirubin 0.3  0.3 - 1.2 mg/dL   GFR calc non Af Amer 87 (*) >90 mL/min   GFR calc Af Amer >90  >90 mL/min  D-DIMER, QUANTITATIVE      Component Value Range   D-Dimer, Quant 0.95 (*) 0.00 - 0.48 ug/mL-FEU  APTT      Component Value Range   aPTT 29  24 - 37 seconds  PROTIME-INR      Component Value Range   Prothrombin Time 13.3  11.6 - 15.2 seconds   INR 1.02  0.00 - 1.49  PRO B NATRIURETIC PEPTIDE      Component Value Range   Pro B Natriuretic peptide (BNP) 625.9 (*) 0 - 125 pg/mL  TROPONIN I      Component Value Range   Troponin I <0.30  <0.30 ng/mL   Laboratory interpretation all normal except mildly elevated BNP, elevated d-dimer   Dg Chest 2 View  04/03/2012  *RADIOLOGY REPORT*  Clinical Data: Leg swelling, shortness of breath.  CHEST - 2 VIEW  Comparison: 09/12/2011  Findings: Heart size upper normal.  Aortic tortuosity.  Central vascular congestion. There is peribronchial cuffing and interstitial prominence.  No pleural effusion.  No pneumothorax. No acute osseous finding.  Partially imaged left humeral head screw.  IMPRESSION:  Central vascular congestion with peribronchial thickening / interstitial prominence, may reflect pulmonary edema given the stated history.   Original Report Authenticated By: Waneta Martins, M.D.    US Venous Img Lower Bilateral  04/03/2012  *RADIOLOGY REPORT*  Clinical Data: Bilateral leg pain.  The  BILATERAL LOWER EXTREMITY VENOUS DUPLEX ULTRASOUND  Technique:  Gray-scale sonography with graded compression, as well as color Doppler and duplex ultrasound, were performed to evaluate the deep venous system of both lower extremities from the level of the common femoral vein through the popliteal and proximal calf veins.  Spectral Doppler was utilized to evaluate flow at rest and with distal augmentation maneuvers.  Comparison:  None.  Findings:  Normal compressibility of bilateral common femoral, superficial femoral, and popliteal veins is demonstrated, as well as the visualized proximal calf veins.  No filling defects to suggest DVT on grayscale or color Doppler imaging.  Doppler waveforms show normal direction of venous flow, normal respiratory phasicity and response to augmentation.  Proximal right inguinal lymph nodes are incidentally noted.  IMPRESSION: No evidence of deep vein thrombosis in either lower extremity.   Original Report Authenticated By: Jamesetta Orleans. MATTERN, M.D.    Ct Angio Chest W/cm &/or Wo Cm  04/03/2012  *RADIOLOGY REPORT*  Clinical Data: Leg swelling shortness of breath.  CT ANGIOGRAPHY CHEST  Technique:  Multidetector CT imaging of the chest using the standard protocol during bolus administration  of intravenous contrast. Multiplanar reconstructed images including MIPs were obtained and reviewed to evaluate the vascular anatomy.  Contrast:  100 Omnipaque 350  Comparison: None.  Findings: Image quality is degraded by patient body habitus and bolus timing.  Within these limitations, there is no evidence for a large central pulmonary embolus.  No lobe are more segmental pulmonary  emboli.  Subsegmental and more distal pulmonary arteries may not be reliably evaluated secondary to above described limitations.  There is mild bilateral axillary lymphadenopathy.  Index right axillary lymph node measures 2.6 x 1.7 cm.  Prominent lymph node in the left axilla measures 2.2 x 2.1 cm.  There is no mediastinal or hilar lymphadenopathy.  The heart size is at upper normal.  There is no pericardial or pleural effusion.  Scattered areas of ground-glass attenuation are seen bilaterally, likely atelectatic.  The 8 mm left lower lobe pulmonary nodule is visible on image 56.  Bone windows reveal no worrisome lytic or sclerotic osseous lesions.  IMPRESSION: No evidence for pulmonary embolus on today's study.  The subsegmental and more distal pulmonary arteries may not be reliably evaluated given the image noise from body habitus and bolus timing.  Bilateral axillary lymphadenopathy.   Original Report Authenticated By: ERIC A. MANSELL, M.D.      Date: 04/03/2012  Rate: 72  Rhythm: normal sinus rhythm  QRS Axis: normal  Intervals: normal  ST/T Wave abnormalities: normal  Conduction Disutrbances:none  Narrative Interpretation: Q waves anterior leads  Old EKG Reviewed: unchanged from 07/17/2011    1. Peripheral edema     New Prescriptions   HYDROCHLOROTHIAZIDE (HYDRODIURIL) 25 MG TABLET    Take 1 tablet (25 mg total) by mouth daily as needed (for swelling).   POTASSIUM CHLORIDE (K-DUR) 10 MEQ TABLET    Take 1 po prn with your fluid pill    Plan discharge  Devoria Albe, MD, FACEP   MDM   I personally performed the services described in this documentation, which was scribed in my presence. The recorded information has been reviewed and considered.  Devoria Albe, MD, Armando Gang    Ward Givens, MD 04/03/12 [phone]

## 2012-04-03 NOTE — ED Notes (Signed)
Pt c/o sob that started two days, worse with exertion, pt c/o bilateral leg swelling that started yesterday, denies any chest, does have headache and pain to bilateral legs from swelling,

## 2012-04-10 NOTE — ED Notes (Signed)
Pt presented back to er today requesting school note for 04/03/2012 -04/09/2012, spoke with Dr. Clarene Duke who reviewed pt's chart and was advised that we would only be able to write pt for day seen in er. Work note for 04/03/2012 written and given to pt.

## 2012-08-20 ENCOUNTER — Ambulatory Visit: Payer: Self-pay | Admitting: Orthopedic Surgery

## 2012-08-20 ENCOUNTER — Ambulatory Visit (INDEPENDENT_AMBULATORY_CARE_PROVIDER_SITE_OTHER): Payer: BC Managed Care – PPO | Admitting: Orthopedic Surgery

## 2012-08-20 ENCOUNTER — Encounter: Payer: Self-pay | Admitting: Orthopedic Surgery

## 2012-08-20 ENCOUNTER — Ambulatory Visit (INDEPENDENT_AMBULATORY_CARE_PROVIDER_SITE_OTHER): Payer: BC Managed Care – PPO

## 2012-08-20 VITALS — BP 140/88 | Ht 68.0 in | Wt 258.0 lb

## 2012-08-20 DIAGNOSIS — M5430 Sciatica, unspecified side: Secondary | ICD-10-CM

## 2012-08-20 HISTORY — DX: Sciatica, unspecified side: M54.30

## 2012-08-20 MED ORDER — GABAPENTIN 100 MG PO CAPS: 100.0000 mg | ORAL_CAPSULE | Freq: Three times a day (TID) | ORAL | Status: DC

## 2012-08-20 MED ORDER — PREDNISONE 10 MG PO KIT: 10.0000 mg | PACK | ORAL | Status: DC

## 2012-08-20 NOTE — Progress Notes (Signed)
  Subjective:    Patient ID: Stacy Ashley, female    DOB: 04/06/1946, 67 y.o.   MRN: [id number]  Leg Pain  The incident occurred more than 1 week ago. The incident occurred at home. There was no injury mechanism. The pain is present in the left leg, left hip, left thigh, left knee and left toes. The quality of the pain is described as aching, burning, shooting and stabbing. The pain is at a severity of 10/10. The pain is severe. The pain has been constant since onset. Associated symptoms include numbness and tingling. Pertinent negatives include no inability to bear weight, loss of motion or loss of sensation.      Review of Systems  Skin: Positive for rash.       Hives itching   Allergic/Immunologic: Positive for environmental allergies and food allergies.  Neurological: Positive for tingling and numbness.  All other systems reviewed and are negative.       Objective:   Physical Exam  Constitutional: She is oriented to person, place, and time. She appears well-developed and well-nourished. No distress.  Cardiovascular: Intact distal pulses.   Neurological: She is alert and oriented to person, place, and time. She displays normal reflexes. She exhibits normal muscle tone. Coordination normal.  Skin: Skin is warm and dry. No rash noted. She is not diaphoretic. No erythema. No pallor.  Psychiatric: She has a normal mood and affect. Her behavior is normal. Judgment and thought content normal.  Right Knee Exam  Right knee exam is normal.   Left Knee Exam  Left knee exam is normal.   Back Exam   Tenderness  The patient is experiencing no tenderness (cervico-thoracic-lumbar).   Range of Motion  The patient has normal back ROM.  Muscle Strength  The patient has normal back strength.  Reflexes  Patellar: normal Achilles: normal Babinski's sign: normal   Other  Gait: normal  Erythema: no back redness Scars: absent     straight leg raise mildly positive on the left  negative on the right  x-ray ordered and my in dependent interpretation of this x-ray: radiographs show degenerative disc disease moderate no acute findings  Impression sciatica  Sciatica neuralgia, left - Plan: gabapentin (NEURONTIN) 100 MG capsule, PredniSONE 10 MG KIT, DG Lumbar Spine 2-3 Views          Assessment & Plan:

## 2012-08-20 NOTE — Patient Instructions (Addendum)
Hold off on ibuprofen until prednisone is finished;   Start gabapentin 100 mg 3 times a day Sciatica Sciatica is pain, weakness, numbness, or tingling along the path of the sciatic nerve. The nerve starts in the lower back and runs down the back of each leg. The nerve controls the muscles in the lower leg and in the back of the knee, while also providing sensation to the back of the thigh, lower leg, and the sole of your foot. Sciatica is a symptom of another medical condition. For instance, nerve damage or certain conditions, such as a herniated disk or bone spur on the spine, pinch or put pressure on the sciatic nerve. This causes the pain, weakness, or other sensations normally associated with sciatica. Generally, sciatica only affects one side of the body. CAUSES   Herniated or slipped disc.  Degenerative disk disease.  A pain disorder involving the narrow muscle in the buttocks (piriformis syndrome).  Pelvic injury or fracture.  Pregnancy.  Tumor (rare). SYMPTOMS  Symptoms can vary from mild to very severe. The symptoms usually travel from the low back to the buttocks and down the back of the leg. Symptoms can include:  Mild tingling or dull aches in the lower back, leg, or hip.  Numbness in the back of the calf or sole of the foot.  Burning sensations in the lower back, leg, or hip.  Sharp pains in the lower back, leg, or hip.  Leg weakness.  Severe back pain inhibiting movement. These symptoms may get worse with coughing, sneezing, laughing, or prolonged sitting or standing. Also, being overweight may worsen symptoms. DIAGNOSIS  Your caregiver will perform a physical exam to look for common symptoms of sciatica. He or she may ask you to do certain movements or activities that would trigger sciatic nerve pain. Other tests may be performed to find the cause of the sciatica. These may include:  Blood tests.  X-rays.  Imaging tests, such as an MRI or CT scan. TREATMENT    Treatment is directed at the cause of the sciatic pain. Sometimes, treatment is not necessary and the pain and discomfort goes away on its own. If treatment is needed, your caregiver may suggest:  Over-the-counter medicines to relieve pain.  Prescription medicines, such as anti-inflammatory medicine, muscle relaxants, or narcotics.  Applying heat or ice to the painful area.  Steroid injections to lessen pain, irritation, and inflammation around the nerve.  Reducing activity during periods of pain.  Exercising and stretching to strengthen your abdomen and improve flexibility of your spine. Your caregiver may suggest losing weight if the extra weight makes the back pain worse.  Physical therapy.  Surgery to eliminate what is pressing or pinching the nerve, such as a bone spur or part of a herniated disk. HOME CARE INSTRUCTIONS   Only take over-the-counter or prescription medicines for pain or discomfort as directed by your caregiver.  Apply ice to the affected area for 20 minutes, 3 4 times a day for the first 48 72 hours. Then try heat in the same way.  Exercise, stretch, or perform your usual activities if these do not aggravate your pain.  Attend physical therapy sessions as directed by your caregiver.  Keep all follow-up appointments as directed by your caregiver.  Do not wear high heels or shoes that do not provide proper support.  Check your mattress to see if it is too soft. A firm mattress may lessen your pain and discomfort. SEEK IMMEDIATE MEDICAL CARE IF:  You lose control of your bowel or bladder (incontinence).  You have increasing weakness in the lower back, pelvis, buttocks, or legs.  You have redness or swelling of your back.  You have a burning sensation when you urinate.  You have pain that gets worse when you lie down or awakens you at night.  Your pain is worse than you have experienced in the past.  Your pain is lasting longer than 4 weeks.  You  are suddenly losing weight without reason. MAKE SURE YOU:  Understand these instructions.  Will watch your condition.  Will get help right away if you are not doing well or get worse. Document Released: 05/30/2001 Document Revised: 12/05/2011 Document Reviewed: 10/15/2011 [facility] Patient Information 2013 [place], [place].

## 2012-08-21 ENCOUNTER — Telehealth: Payer: Self-pay | Admitting: Orthopedic Surgery

## 2012-08-21 NOTE — Telephone Encounter (Signed)
Called [facility] with clarification of prednisone 12 day and I advised patient to only take 100mg  of gabapentin and give that time to work.

## 2012-08-21 NOTE — Telephone Encounter (Signed)
Patient called to follow up on (1) Prednisone prescription per office visit 08/20/12 - states [facility] in [place] contacted our office about dosage - is it "6-day or 12-day" ?    (2) regarding Gabapentin - patient is asking if she can take 200mg  instead of the prescribed 100mg , as states "not noticing any difference yet with 100mg ."  States just started the Gabapentin yesterday, 08/20/12.   Patient [phone].

## 2012-08-26 ENCOUNTER — Telehealth: Payer: Self-pay | Admitting: Orthopedic Surgery

## 2012-08-26 NOTE — Telephone Encounter (Signed)
Returned call from message left on 08/23/12.  Spoke with patient and she said she has already spoken with Dr. Romeo Apple.

## 2012-10-10 ENCOUNTER — Other Ambulatory Visit: Payer: Self-pay | Admitting: *Deleted

## 2012-10-10 DIAGNOSIS — M79605 Pain in left leg: Secondary | ICD-10-CM

## 2012-10-10 MED ORDER — IBUPROFEN 800 MG PO TABS: 800.0000 mg | ORAL_TABLET | Freq: Three times a day (TID) | ORAL | Status: AC | PRN

## 2012-11-01 ENCOUNTER — Ambulatory Visit: Payer: BC Managed Care – PPO | Admitting: Family Medicine

## 2012-11-04 ENCOUNTER — Ambulatory Visit: Payer: BC Managed Care – PPO | Admitting: Family Medicine

## 2012-11-07 ENCOUNTER — Ambulatory Visit (INDEPENDENT_AMBULATORY_CARE_PROVIDER_SITE_OTHER): Payer: BC Managed Care – PPO | Admitting: Family Medicine

## 2012-11-07 ENCOUNTER — Encounter: Payer: Self-pay | Admitting: Family Medicine

## 2012-11-07 VITALS — BP 122/80 | Temp 98.0°F | Ht 67.0 in | Wt 261.0 lb

## 2012-11-07 DIAGNOSIS — Z7689 Persons encountering health services in other specified circumstances: Secondary | ICD-10-CM

## 2012-11-07 DIAGNOSIS — R748 Abnormal levels of other serum enzymes: Secondary | ICD-10-CM

## 2012-11-07 DIAGNOSIS — D649 Anemia, unspecified: Secondary | ICD-10-CM

## 2012-11-07 DIAGNOSIS — M51369 Other intervertebral disc degeneration, lumbar region without mention of lumbar back pain or lower extremity pain: Secondary | ICD-10-CM

## 2012-11-07 DIAGNOSIS — R7989 Other specified abnormal findings of blood chemistry: Secondary | ICD-10-CM

## 2012-11-07 DIAGNOSIS — M5136 Other intervertebral disc degeneration, lumbar region: Secondary | ICD-10-CM

## 2012-11-07 DIAGNOSIS — M5137 Other intervertebral disc degeneration, lumbosacral region: Secondary | ICD-10-CM

## 2012-11-07 DIAGNOSIS — Z7189 Other specified counseling: Secondary | ICD-10-CM

## 2012-11-07 HISTORY — DX: Anemia, unspecified: D64.9

## 2012-11-07 LAB — CBC WITH DIFFERENTIAL/PLATELET
Eosinophils Relative: 0.6 % (ref 0.0–5.0)
Monocytes Relative: 5.5 % (ref 3.0–12.0)
Neutrophils Relative %: 76.9 % (ref 43.0–77.0)
Platelets: 290 10*3/uL (ref 150.0–400.0)
WBC: 13.3 10*3/uL — ABNORMAL HIGH (ref 4.5–10.5)

## 2012-11-07 LAB — LIPID PANEL
Cholesterol: 183 mg/dL (ref 0–200)
LDL Cholesterol: 110 mg/dL — ABNORMAL HIGH (ref 0–99)

## 2012-11-07 LAB — COMPREHENSIVE METABOLIC PANEL
Albumin: 3.8 g/dL (ref 3.5–5.2)
Alkaline Phosphatase: 99 U/L (ref 39–117)
CO2: 27 mEq/L (ref 19–32)
Chloride: 106 mEq/L (ref 96–112)
GFR: 92.61 mL/min (ref >60.00)
Glucose, Bld: 91 mg/dL (ref 70–99)
Potassium: 3.5 mEq/L (ref 3.5–5.1)
Sodium: 140 mEq/L (ref 135–145)
Total Protein: 7.3 g/dL (ref 6.0–8.3)

## 2012-11-07 LAB — TSH: TSH: 1.16 u[IU]/mL (ref 0.35–5.50)
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
## 2012-11-07 NOTE — Progress Notes (Signed)
Called and spoke with pt and pt is aware. Cbc ordered and pt is aware of appt on 6/6.

## 2012-11-07 NOTE — Patient Instructions (Signed)
-  We have ordered labs or studies at this visit. It can take up to 1-2 weeks for results and processing. We will contact you with instructions IF your results are abnormal. Normal results will be released to your [facility]. If you have not heard from [place] or can not find your results in [facility] in 2 weeks please contact our office.  -PLEASE SIGN UP FOR MYCHART TODAY   We recommend the following healthy lifestyle measures: - eat a healthy diet consisting of lots of vegetables, fruits, beans, nuts, seeds, healthy meats such as white chicken and fish and whole grains.  - avoid fried foods, fast food, processed foods, sodas, red meet and other fattening foods.  - get a least 150 minutes of aerobic exercise per week.   Schedule mammogram  Follow up in: next month for CPE with pap

## 2012-11-07 NOTE — Addendum Note (Signed)
Addended by: Azucena Freed on: 11/07/2012 02:54 PM   Modules accepted: Orders

## 2012-11-08 ENCOUNTER — Telehealth: Payer: Self-pay | Admitting: Internal Medicine

## 2012-11-08 NOTE — Telephone Encounter (Signed)
Pt called to set up her tcs. She said her PCP recommended that she call. I told her that I would have the triage nurse call her and she may not need an OV to set up procedure. [phone]

## 2012-11-12 NOTE — Telephone Encounter (Signed)
Pt has hx of adenomatous polyps. Ov with Gerrit Halls, NP on 11/21/2012 at 8:00 AM.

## 2012-11-16 ENCOUNTER — Emergency Department (HOSPITAL_COMMUNITY)
Admission: EM | Admit: 2012-11-16 | Discharge: 2012-11-16 | Disposition: A | Discharge status: Home / Self Care | Payer: BC Managed Care – PPO | Attending: Emergency Medicine | Admitting: Emergency Medicine

## 2012-11-16 ENCOUNTER — Encounter (HOSPITAL_COMMUNITY): Payer: Self-pay | Admitting: *Deleted

## 2012-11-16 DIAGNOSIS — Z8781 Personal history of (healed) traumatic fracture: Secondary | ICD-10-CM | POA: Insufficient documentation

## 2012-11-16 DIAGNOSIS — M62838 Other muscle spasm: Secondary | ICD-10-CM | POA: Insufficient documentation

## 2012-11-16 DIAGNOSIS — M549 Dorsalgia, unspecified: Secondary | ICD-10-CM | POA: Insufficient documentation

## 2012-11-16 DIAGNOSIS — Z880 Allergy status to penicillin: Secondary | ICD-10-CM | POA: Insufficient documentation

## 2012-11-16 DIAGNOSIS — Z8739 Personal history of other diseases of the musculoskeletal system and connective tissue: Secondary | ICD-10-CM | POA: Insufficient documentation

## 2012-11-16 DIAGNOSIS — Z8669 Personal history of other diseases of the nervous system and sense organs: Secondary | ICD-10-CM | POA: Insufficient documentation

## 2012-11-16 DIAGNOSIS — Z862 Personal history of diseases of the blood and blood-forming organs and certain disorders involving the immune mechanism: Secondary | ICD-10-CM | POA: Insufficient documentation

## 2012-11-16 DIAGNOSIS — Z872 Personal history of diseases of the skin and subcutaneous tissue: Secondary | ICD-10-CM | POA: Insufficient documentation

## 2012-11-16 DIAGNOSIS — I252 Old myocardial infarction: Secondary | ICD-10-CM | POA: Insufficient documentation

## 2012-11-16 LAB — BASIC METABOLIC PANEL
CO2: 27 mEq/L (ref 19–32)
Calcium: 9 mg/dL (ref 8.4–10.5)
Sodium: 141 mEq/L (ref 135–145)
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
## 2012-11-16 NOTE — ED Provider Notes (Signed)
Medical screening examination/treatment/procedure(s) were performed by non-physician practitioner and as supervising physician I was immediately available for consultation/collaboration.    Vida Roller, MD 11/16/12 [phone]

## 2012-11-16 NOTE — ED Notes (Signed)
Pt presents to er with c/o "muscle spasms" on left upper leg, spasms started last night, has had two episodes of the spasms that were better after standing.

## 2012-11-16 NOTE — ED Notes (Signed)
Discharge instructions reviewed with pt, questions answered. Pt verbalized understanding.  

## 2012-11-19 ENCOUNTER — Encounter: Payer: Self-pay | Admitting: Internal Medicine

## 2012-11-21 ENCOUNTER — Ambulatory Visit: Payer: BC Managed Care – PPO | Admitting: Gastroenterology

## 2012-11-22 ENCOUNTER — Other Ambulatory Visit: Payer: BC Managed Care – PPO

## 2012-11-25 ENCOUNTER — Other Ambulatory Visit: Payer: BC Managed Care – PPO

## 2012-12-09 ENCOUNTER — Encounter: Payer: BC Managed Care – PPO | Admitting: Family Medicine

## 2012-12-09 NOTE — Progress Notes (Signed)
error    This encounter was created in error - please disregard.

## 2012-12-10 ENCOUNTER — Telehealth: Payer: Self-pay | Admitting: Gastroenterology

## 2012-12-10 ENCOUNTER — Ambulatory Visit: Payer: BC Managed Care – PPO | Admitting: Gastroenterology

## 2012-12-10 NOTE — Telephone Encounter (Signed)
Pt was a no show

## 2012-12-11 NOTE — Telephone Encounter (Signed)
Please send note for f/u 

## 2012-12-24 ENCOUNTER — Encounter: Payer: Self-pay | Admitting: Gastroenterology

## 2012-12-24 NOTE — Telephone Encounter (Signed)
Mailed letter for pt to call our office to [facility] °

## 2013-01-27 ENCOUNTER — Emergency Department (HOSPITAL_COMMUNITY): Payer: Medicare Other

## 2013-01-27 ENCOUNTER — Encounter (HOSPITAL_COMMUNITY): Payer: Self-pay | Admitting: *Deleted

## 2013-01-27 ENCOUNTER — Emergency Department (HOSPITAL_COMMUNITY)
Admission: EM | Admit: 2013-01-27 | Discharge: 2013-01-27 | Disposition: A | Discharge status: Home / Self Care | Payer: Medicare Other | Attending: Emergency Medicine | Admitting: Emergency Medicine

## 2013-01-27 DIAGNOSIS — Z8781 Personal history of (healed) traumatic fracture: Secondary | ICD-10-CM | POA: Diagnosis not present

## 2013-01-27 DIAGNOSIS — D649 Anemia, unspecified: Secondary | ICD-10-CM | POA: Diagnosis not present

## 2013-01-27 DIAGNOSIS — Z880 Allergy status to penicillin: Secondary | ICD-10-CM | POA: Insufficient documentation

## 2013-01-27 DIAGNOSIS — Y9389 Activity, other specified: Secondary | ICD-10-CM | POA: Insufficient documentation

## 2013-01-27 DIAGNOSIS — S4980XA Other specified injuries of shoulder and upper arm, unspecified arm, initial encounter: Secondary | ICD-10-CM | POA: Diagnosis not present

## 2013-01-27 DIAGNOSIS — S6980XA Other specified injuries of unspecified wrist, hand and finger(s), initial encounter: Secondary | ICD-10-CM | POA: Insufficient documentation

## 2013-01-27 DIAGNOSIS — Y92410 Unspecified street and highway as the place of occurrence of the external cause: Secondary | ICD-10-CM | POA: Diagnosis not present

## 2013-01-27 DIAGNOSIS — Z8739 Personal history of other diseases of the musculoskeletal system and connective tissue: Secondary | ICD-10-CM | POA: Diagnosis not present

## 2013-01-27 DIAGNOSIS — S79929A Unspecified injury of unspecified thigh, initial encounter: Secondary | ICD-10-CM | POA: Diagnosis present

## 2013-01-27 DIAGNOSIS — I252 Old myocardial infarction: Secondary | ICD-10-CM | POA: Insufficient documentation

## 2013-01-27 DIAGNOSIS — Z872 Personal history of diseases of the skin and subcutaneous tissue: Secondary | ICD-10-CM | POA: Insufficient documentation

## 2013-01-27 DIAGNOSIS — S79919A Unspecified injury of unspecified hip, initial encounter: Secondary | ICD-10-CM | POA: Diagnosis present

## 2013-01-27 DIAGNOSIS — Z8669 Personal history of other diseases of the nervous system and sense organs: Secondary | ICD-10-CM | POA: Insufficient documentation

## 2013-01-27 DIAGNOSIS — S6990XA Unspecified injury of unspecified wrist, hand and finger(s), initial encounter: Secondary | ICD-10-CM | POA: Insufficient documentation

## 2013-01-27 DIAGNOSIS — S46909A Unspecified injury of unspecified muscle, fascia and tendon at shoulder and upper arm level, unspecified arm, initial encounter: Secondary | ICD-10-CM | POA: Insufficient documentation

## 2013-01-27 DIAGNOSIS — G8929 Other chronic pain: Secondary | ICD-10-CM | POA: Insufficient documentation

## 2013-01-27 MED ORDER — CYCLOBENZAPRINE HCL 10 MG PO TABS: 10.0000 mg | ORAL_TABLET | Freq: Three times a day (TID) | ORAL | Status: DC | PRN
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
## 2013-01-29 ENCOUNTER — Ambulatory Visit (INDEPENDENT_AMBULATORY_CARE_PROVIDER_SITE_OTHER): Payer: Medicare Other | Admitting: Family Medicine

## 2013-01-29 DIAGNOSIS — D72829 Elevated white blood cell count, unspecified: Secondary | ICD-10-CM

## 2013-01-29 DIAGNOSIS — R7989 Other specified abnormal findings of blood chemistry: Secondary | ICD-10-CM

## 2013-01-29 DIAGNOSIS — M25511 Pain in right shoulder: Secondary | ICD-10-CM

## 2013-01-29 DIAGNOSIS — D649 Anemia, unspecified: Secondary | ICD-10-CM

## 2013-01-29 DIAGNOSIS — M25569 Pain in unspecified knee: Secondary | ICD-10-CM

## 2013-01-29 DIAGNOSIS — M25519 Pain in unspecified shoulder: Secondary | ICD-10-CM

## 2013-01-29 DIAGNOSIS — Z1239 Encounter for other screening for malignant neoplasm of breast: Secondary | ICD-10-CM

## 2013-01-29 DIAGNOSIS — M79645 Pain in left finger(s): Secondary | ICD-10-CM

## 2013-01-29 DIAGNOSIS — M25562 Pain in left knee: Secondary | ICD-10-CM

## 2013-01-29 DIAGNOSIS — M79609 Pain in unspecified limb: Secondary | ICD-10-CM

## 2013-01-29 LAB — CBC WITH DIFFERENTIAL/PLATELET
Basophils Relative: 0.2 % (ref 0.0–3.0)
Eosinophils Relative: 2.6 % (ref 0.0–5.0)
Lymphocytes Relative: 20.9 % (ref 12.0–46.0)
MCV: 76.5 fl — ABNORMAL LOW (ref 78.0–100.0)
Monocytes Absolute: 0.6 10*3/uL (ref 0.1–1.0)
Monocytes Relative: 6.1 % (ref 3.0–12.0)
Neutrophils Relative %: 70.2 % (ref 43.0–77.0)
RBC: 4.62 Mil/uL (ref 3.87–5.11)
WBC: 9.8 10*3/uL (ref 4.5–10.5)

## 2013-01-29 IMAGING — CR DG CHEST 2V
2 series · 2 of 2 positions shown · non-contrast
Comparison: 09/12/2011

CLINICAL DATA: Leg swelling, shortness of breath.

CHEST - 2 VIEW

[view not recorded (1 of 2)]
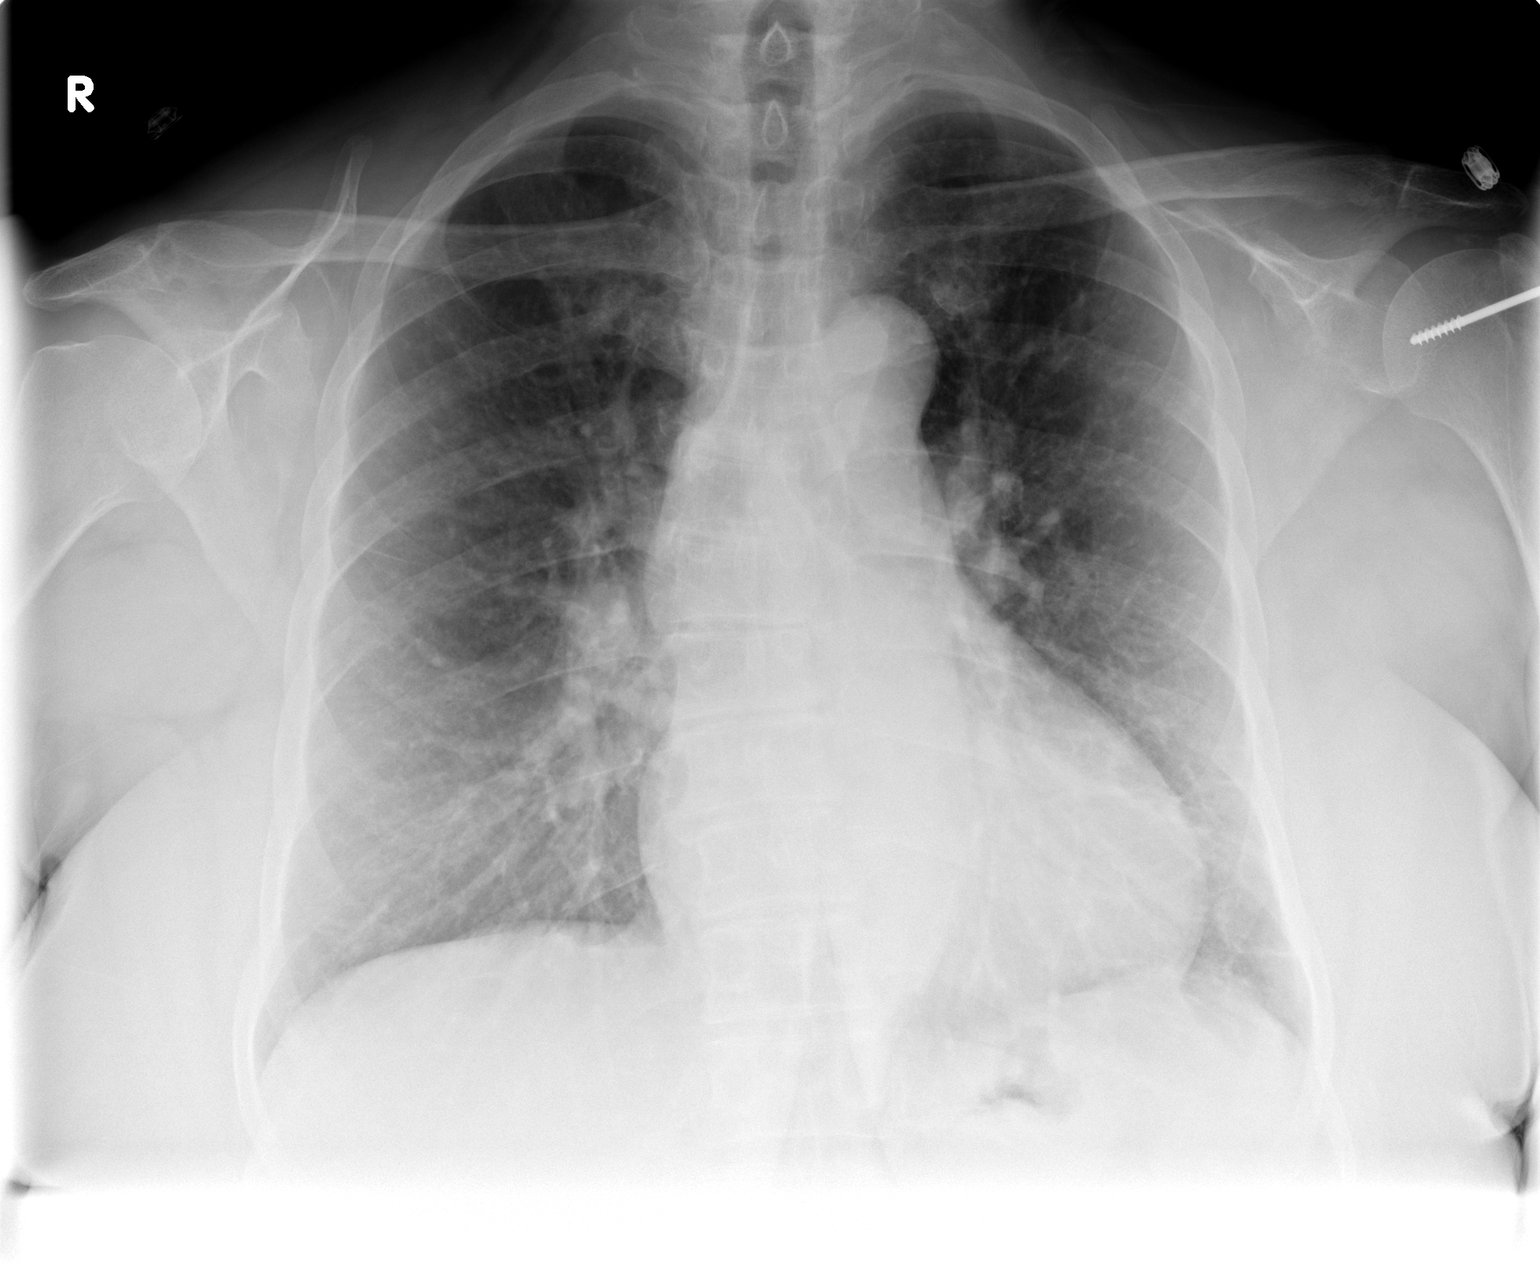

[view not recorded (2 of 2)]
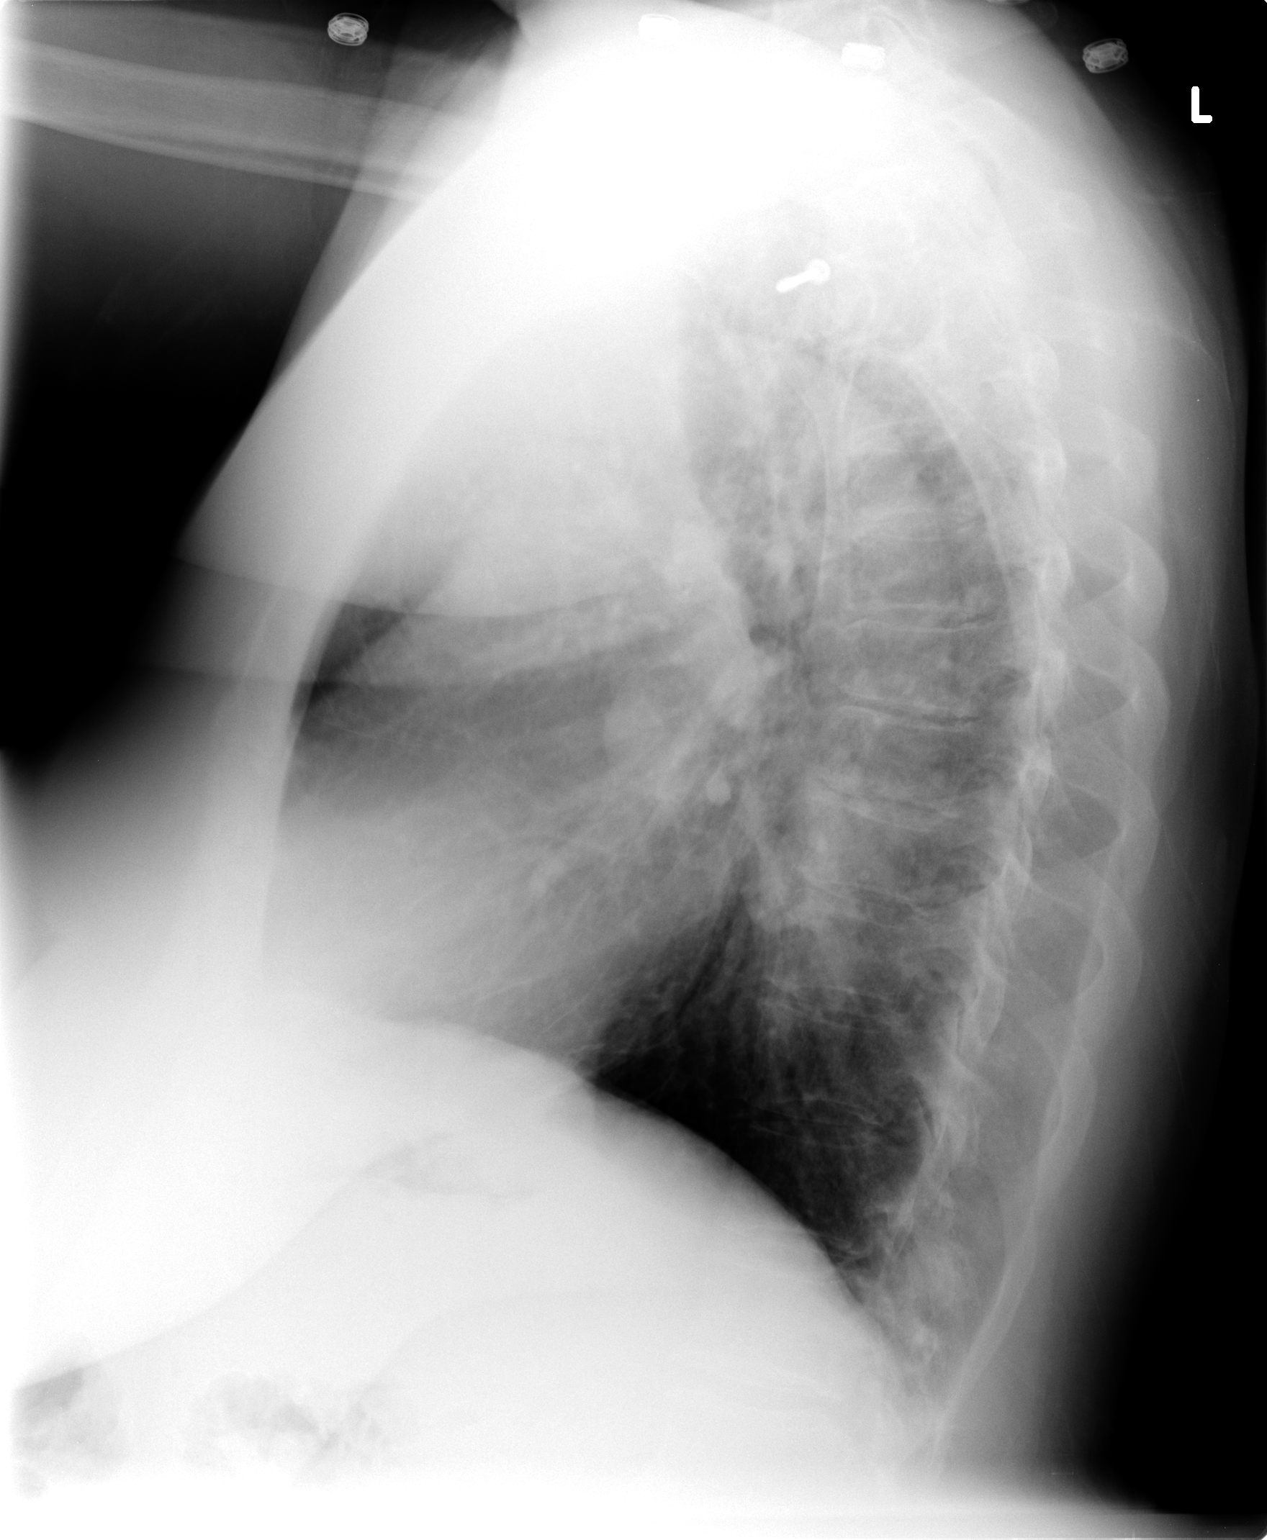

[2 of 2 positions shown; findings below may reference images not displayed]

FINDINGS: Heart size upper normal.  Aortic tortuosity.  Central
vascular congestion. There is peribronchial cuffing and
interstitial prominence.  No pleural effusion.  No pneumothorax. No
acute osseous finding.  Partially imaged left humeral head screw.
IMPRESSION: Central vascular congestion with peribronchial thickening /
interstitial prominence, may reflect pulmonary edema given the
stated history.

## 2013-01-29 NOTE — Addendum Note (Signed)
Addended by: Bonnye Fava on: 01/29/2013 09:20 AM   Modules accepted: Orders
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
## 2013-01-30 NOTE — Progress Notes (Signed)
Quick Note:  Left a message for pt to return call. ______ 

## 2013-01-31 NOTE — Addendum Note (Signed)
Addended by: Azucena Freed on: 01/31/2013 11:34 AM   Modules accepted: Orders

## 2013-01-31 NOTE — Progress Notes (Signed)
Quick Note:  Called and spoke with pt and pt is aware. Pt also aware of lab appt on 8/18. ______

## 2013-02-03 ENCOUNTER — Other Ambulatory Visit: Payer: Medicare Other

## 2013-02-25 ENCOUNTER — Encounter: Payer: Self-pay | Admitting: Family Medicine

## 2013-02-25 ENCOUNTER — Ambulatory Visit (INDEPENDENT_AMBULATORY_CARE_PROVIDER_SITE_OTHER): Payer: Medicare Other | Admitting: Family Medicine

## 2013-02-25 VITALS — BP 138/90 | Temp 97.7°F | Wt 275.0 lb

## 2013-02-25 DIAGNOSIS — T148XXA Other injury of unspecified body region, initial encounter: Secondary | ICD-10-CM

## 2013-02-25 DIAGNOSIS — M79605 Pain in left leg: Secondary | ICD-10-CM

## 2013-02-25 DIAGNOSIS — D649 Anemia, unspecified: Secondary | ICD-10-CM

## 2013-02-25 DIAGNOSIS — M79609 Pain in unspecified limb: Secondary | ICD-10-CM

## 2013-02-25 LAB — IBC PANEL
Iron: 30 ug/dL — ABNORMAL LOW (ref 42–145)
Saturation Ratios: 9 % — ABNORMAL LOW (ref 20.0–50.0)
Transferrin: 239.3 mg/dL (ref 212.0–360.0)

## 2013-02-25 LAB — FOLATE: Folate: 14.6 ng/mL (ref >5.9)

## 2013-02-25 LAB — FERRITIN: Ferritin: 25.6 ng/mL (ref 10.0–291.0)
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
## 2013-02-25 NOTE — Addendum Note (Signed)
Addended by: Bonnye Fava on: 02/25/2013 08:41 AM   Modules accepted: Orders

## 2013-02-25 NOTE — Addendum Note (Signed)
Addended by: Bonnye Fava on: 02/25/2013 08:38 AM   Modules accepted: Orders

## 2013-02-26 NOTE — Progress Notes (Signed)
Quick Note:  Attempted to call pt; number incorrect in chart; ______

## 2013-02-27 ENCOUNTER — Other Ambulatory Visit: Payer: Self-pay | Admitting: Family Medicine

## 2013-02-27 DIAGNOSIS — M79605 Pain in left leg: Secondary | ICD-10-CM

## 2013-02-27 DIAGNOSIS — T148XXA Other injury of unspecified body region, initial encounter: Secondary | ICD-10-CM

## 2013-03-06 NOTE — Progress Notes (Signed)
Quick Note:  Left a message for pt to return call. ______ 

## 2013-03-10 NOTE — Progress Notes (Signed)
Quick Note:    Letter mailed to patient  ______

## 2013-03-17 ENCOUNTER — Encounter: Payer: Self-pay | Admitting: Family Medicine

## 2013-03-18 ENCOUNTER — Ambulatory Visit: Payer: Medicare Other | Admitting: Gastroenterology

## 2014-04-20 ENCOUNTER — Encounter: Payer: Self-pay | Admitting: Family Medicine

## 2018-11-25 ENCOUNTER — Telehealth: Payer: Self-pay | Admitting: Orthopedic Surgery

## 2018-11-25 NOTE — Telephone Encounter (Signed)
BRING HER IN FOR [facility] [place] E/M
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
## 2018-11-26 NOTE — Telephone Encounter (Signed)
Patient returned call; appointment scheduled; aware. °

## 2018-11-26 NOTE — Telephone Encounter (Signed)
Called back to patient to offer appointment; left message. °

## 2018-11-27 ENCOUNTER — Ambulatory Visit: Payer: Self-pay | Admitting: Family Medicine

## 2018-11-27 ENCOUNTER — Other Ambulatory Visit: Payer: Self-pay

## 2018-11-27 ENCOUNTER — Emergency Department (HOSPITAL_COMMUNITY)
Admission: EM | Admit: 2018-11-27 | Discharge: 2018-11-27 | Disposition: A | Discharge status: Home / Self Care | Payer: Medicare Other | Attending: Emergency Medicine | Admitting: Emergency Medicine

## 2018-11-27 ENCOUNTER — Emergency Department (HOSPITAL_COMMUNITY): Payer: Medicare Other

## 2018-11-27 ENCOUNTER — Encounter (HOSPITAL_COMMUNITY): Payer: Self-pay

## 2018-11-27 DIAGNOSIS — Z20828 Contact with and (suspected) exposure to other viral communicable diseases: Secondary | ICD-10-CM | POA: Insufficient documentation

## 2018-11-27 DIAGNOSIS — I252 Old myocardial infarction: Secondary | ICD-10-CM | POA: Diagnosis not present

## 2018-11-27 DIAGNOSIS — J4521 Mild intermittent asthma with (acute) exacerbation: Secondary | ICD-10-CM | POA: Diagnosis not present

## 2018-11-27 DIAGNOSIS — Z79899 Other long term (current) drug therapy: Secondary | ICD-10-CM | POA: Diagnosis not present

## 2018-11-27 DIAGNOSIS — R0602 Shortness of breath: Secondary | ICD-10-CM | POA: Diagnosis present

## 2018-11-27 HISTORY — DX: Unspecified asthma, uncomplicated: J45.909

## 2018-11-27 LAB — COMPREHENSIVE METABOLIC PANEL
ALT: 11 U/L (ref 0–44)
AST: 16 U/L (ref 15–41)
Albumin: 3.8 g/dL (ref 3.5–5.0)
Alkaline Phosphatase: 104 U/L (ref 38–126)
Anion gap: 13 (ref 5–15)
BUN: 10 mg/dL (ref 8–23)
CO2: 25 mmol/L (ref 22–32)
Calcium: 8.7 mg/dL — ABNORMAL LOW (ref 8.9–10.3)
Chloride: 106 mmol/L (ref 98–111)
Creatinine, Ser: 0.74 mg/dL (ref 0.44–1.00)
GFR calc Af Amer: >60 mL/min (ref >60)
GFR calc non Af Amer: >60 mL/min (ref >60)
Glucose, Bld: 88 mg/dL (ref 70–99)
Potassium: 3.2 mmol/L — ABNORMAL LOW (ref 3.5–5.1)
Sodium: 144 mmol/L (ref 135–145)
Total Bilirubin: 0.6 mg/dL (ref 0.3–1.2)
Total Protein: 6.7 g/dL (ref 6.5–8.1)

## 2018-11-27 LAB — CBC WITH DIFFERENTIAL/PLATELET
Abs Immature Granulocytes: 0.01 10*3/uL (ref 0.00–0.07)
Basophils Absolute: 0 10*3/uL (ref 0.0–0.1)
Basophils Relative: 0 %
Eosinophils Absolute: 0.4 10*3/uL (ref 0.0–0.5)
Eosinophils Relative: 5 %
HCT: 36.7 % (ref 36.0–46.0)
Hemoglobin: 11.5 g/dL — ABNORMAL LOW (ref 12.0–15.0)
Immature Granulocytes: 0 %
Lymphocytes Relative: 10 %
Lymphs Abs: 0.8 10*3/uL (ref 0.7–4.0)
MCH: 24.6 pg — ABNORMAL LOW (ref 26.0–34.0)
MCHC: 31.3 g/dL (ref 30.0–36.0)
MCV: 78.4 fL — ABNORMAL LOW (ref 80.0–100.0)
Monocytes Absolute: 0.5 10*3/uL (ref 0.1–1.0)
Monocytes Relative: 6 %
Neutro Abs: 6 10*3/uL (ref 1.7–7.7)
Neutrophils Relative %: 79 %
Platelets: 245 10*3/uL (ref 150–400)
RBC: 4.68 MIL/uL (ref 3.87–5.11)
RDW: 14.6 % (ref 11.5–15.5)
WBC: 7.6 10*3/uL (ref 4.0–10.5)
nRBC: 0 % (ref 0.0–0.2)

## 2018-11-27 LAB — SARS CORONAVIRUS 2 BY RT PCR (HOSPITAL ORDER, PERFORMED IN ~~LOC~~ HOSPITAL LAB): SARS Coronavirus 2: NEGATIVE

## 2018-11-27 LAB — BRAIN NATRIURETIC PEPTIDE: B Natriuretic Peptide: 69 pg/mL (ref 0.0–100.0)

## 2018-11-27 MED ORDER — METHYLPREDNISOLONE SODIUM SUCC 125 MG IJ SOLR
INTRAMUSCULAR | Status: AC
  Filled 2018-11-27: qty 2

## 2018-11-27 MED ORDER — METHYLPREDNISOLONE SODIUM SUCC 125 MG IJ SOLR
125.0000 mg | Freq: Once | INTRAMUSCULAR | Status: AC
  Administered 2018-11-27: 125 mg via INTRAVENOUS
  Filled 2018-11-27: qty 2

## 2018-11-27 MED ORDER — ALBUTEROL SULFATE HFA 108 (90 BASE) MCG/ACT IN AERS
8.0000 | INHALATION_SPRAY | Freq: Once | RESPIRATORY_TRACT | Status: AC
  Administered 2018-11-27: 8 via RESPIRATORY_TRACT
  Filled 2018-11-27: qty 6.7

## 2018-11-27 MED ORDER — AZITHROMYCIN 250 MG PO TABS: ORAL_TABLET | ORAL | 0 refills | Status: DC

## 2018-11-27 MED ORDER — MAGNESIUM SULFATE 2 GM/50ML IV SOLN
2.0000 g | Freq: Once | INTRAVENOUS | Status: AC
  Administered 2018-11-27: 2 g via INTRAVENOUS
  Filled 2018-11-27: qty 50

## 2018-11-27 MED ORDER — METHYLPREDNISOLONE 4 MG PO TBPK: ORAL_TABLET | ORAL | 0 refills | Status: DC

## 2018-11-27 MED ORDER — AEROCHAMBER PLUS FLO-VU MISC
1.0000 | Freq: Once | Status: AC
  Administered 2018-11-27: 1
  Filled 2018-11-27: qty 1

## 2018-11-27 MED ORDER — IPRATROPIUM BROMIDE HFA 17 MCG/ACT IN AERS
2.0000 | INHALATION_SPRAY | Freq: Once | RESPIRATORY_TRACT | Status: AC
  Administered 2018-11-27: 2 via RESPIRATORY_TRACT
  Filled 2018-11-27: qty 12.9

## 2018-11-27 NOTE — Discharge Instructions (Addendum)
Get help right away if: °Your peak flow reading is less than 50% of your personal best. This is in the red zone, which means "danger." °You have severe trouble breathing. °You develop chest pain or discomfort. °Your medicines no longer seem to be helping. °You vomit. °You cannot eat or drink without vomiting. °You are coughing up yellow, green, brown, or bloody mucus. °You have a fever and your symptoms suddenly get worse. °You have trouble swallowing. °You feel very tired, and breathing becomes tiring. °
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
## 2018-11-27 NOTE — Telephone Encounter (Signed)
Pt is at [facility] ED for evaluation.

## 2018-11-27 NOTE — Telephone Encounter (Signed)
Will monitor for ED arrival.  

## 2018-11-27 NOTE — ED Notes (Signed)
Delay in meds is due to pharmacy refilling pyxis. Will give meds within next 10 mins.

## 2018-11-27 NOTE — ED Notes (Addendum)
Pharmacy bringing down Atrovent

## 2018-11-27 NOTE — Telephone Encounter (Signed)
Pt. Reports she has shortness of breath x 24 hours. Has asthma. Has recently been [place] with her son and grandchildren. Difficulty sleeping - has to prop up on pillows. Inhaler "is not helping much.I can't get a hold of this." Has cough with wheezing.Instructed pt. To have someone take her to ED. Verbalizes understanding. Reason for Disposition . [1] MODERATE difficulty breathing (e.g., speaks in phrases, SOB even at rest, pulse 100-120) AND [2] NEW-onset or WORSE than normal  Answer Assessment - Initial Assessment Questions 1. RESPIRATORY STATUS: "Describe your breathing?" (e.g., wheezing, shortness of breath, unable to speak, severe coughing)      Shortness of breath 2. ONSET: "When did this breathing problem begin?"      24 hours ago 3. PATTERN "Does the difficult breathing come and go, or has it been constant since it started?"      Constant  4. SEVERITY: "How bad is your breathing?" (e.g., mild, moderate, severe)    - MILD: No SOB at rest, mild SOB with walking, speaks normally in sentences, can lay down, no retractions, pulse < 100.    - MODERATE: SOB at rest, SOB with minimal exertion and prefers to sit, cannot lie down flat, speaks in phrases, mild retractions, audible wheezing, pulse 100-120.    - SEVERE: Very SOB at rest, speaks in single words, struggling to breathe, sitting hunched forward, retractions, pulse > 120      Moderate 5. RECURRENT SYMPTOM: "Have you had difficulty breathing before?" If so, ask: "When was the last time?" and "What happened that time?"      Yes 6. CARDIAC HISTORY: "Do you have any history of heart disease?" (e.g., heart attack, angina, bypass surgery, angioplasty)      No 7. LUNG HISTORY: "Do you have any history of lung disease?"  (e.g., pulmonary embolus, asthma, emphysema)     Asthma 8. CAUSE: "What do you think is causing the breathing problem?"      Asthma 9. OTHER SYMPTOMS: "Do you have any other symptoms? (e.g., dizziness, runny nose, cough,  chest pain, fever)     Cough 10. PREGNANCY: "Is there any chance you are pregnant?" "When was your last menstrual period?"       No 11. TRAVEL: "Have you traveled out of the country in the last month?" (e.g., travel history, exposures)       No  Protocols used: BREATHING DIFFICULTY-A-AH

## 2018-11-27 NOTE — ED Notes (Signed)
Pt states she feels MUCH breathing. Breathing is not as labored.
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
## 2018-12-09 ENCOUNTER — Ambulatory Visit (INDEPENDENT_AMBULATORY_CARE_PROVIDER_SITE_OTHER): Payer: Medicare Other | Admitting: Orthopedic Surgery

## 2018-12-09 ENCOUNTER — Encounter: Payer: Self-pay | Admitting: Orthopedic Surgery

## 2018-12-09 ENCOUNTER — Ambulatory Visit (INDEPENDENT_AMBULATORY_CARE_PROVIDER_SITE_OTHER): Payer: Medicare Other

## 2018-12-09 ENCOUNTER — Other Ambulatory Visit: Payer: Self-pay

## 2018-12-09 VITALS — BP 140/80 | HR 63 | Temp 97.4°F | Ht 66.0 in | Wt 221.0 lb

## 2018-12-09 DIAGNOSIS — G8929 Other chronic pain: Secondary | ICD-10-CM

## 2018-12-09 DIAGNOSIS — M25551 Pain in right hip: Secondary | ICD-10-CM | POA: Diagnosis not present

## 2018-12-09 DIAGNOSIS — M169 Osteoarthritis of hip, unspecified: Secondary | ICD-10-CM | POA: Diagnosis not present

## 2018-12-09 DIAGNOSIS — M24159 Other articular cartilage disorders, unspecified hip: Secondary | ICD-10-CM

## 2018-12-09 NOTE — Patient Instructions (Signed)
Cane left hand Anti-inflammatory of choice over-the-counter Aleve once to 2 times a day as needed for pain or ibuprofen 400 mg every 6 hours as needed for pain Follow-up in 6 weeks
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
## 2018-12-17 ENCOUNTER — Other Ambulatory Visit: Payer: Self-pay

## 2018-12-17 ENCOUNTER — Emergency Department (HOSPITAL_COMMUNITY)
Admission: EM | Admit: 2018-12-17 | Discharge: 2018-12-17 | Disposition: A | Discharge status: Home / Self Care | Payer: Medicare Other | Attending: Emergency Medicine | Admitting: Emergency Medicine

## 2018-12-17 ENCOUNTER — Emergency Department (HOSPITAL_COMMUNITY): Payer: Medicare Other

## 2018-12-17 DIAGNOSIS — J45909 Unspecified asthma, uncomplicated: Secondary | ICD-10-CM | POA: Insufficient documentation

## 2018-12-17 DIAGNOSIS — R0602 Shortness of breath: Secondary | ICD-10-CM | POA: Insufficient documentation

## 2018-12-17 DIAGNOSIS — R0789 Other chest pain: Secondary | ICD-10-CM | POA: Diagnosis not present

## 2018-12-17 DIAGNOSIS — Z79899 Other long term (current) drug therapy: Secondary | ICD-10-CM | POA: Insufficient documentation

## 2018-12-17 DIAGNOSIS — R05 Cough: Secondary | ICD-10-CM | POA: Insufficient documentation

## 2018-12-17 DIAGNOSIS — R059 Cough, unspecified: Secondary | ICD-10-CM

## 2018-12-17 LAB — CBC WITH DIFFERENTIAL/PLATELET
Abs Immature Granulocytes: 0.01 10*3/uL (ref 0.00–0.07)
Basophils Absolute: 0 10*3/uL (ref 0.0–0.1)
Basophils Relative: 0 %
Eosinophils Absolute: 0.4 10*3/uL (ref 0.0–0.5)
Eosinophils Relative: 5 %
HCT: 36.3 % (ref 36.0–46.0)
Hemoglobin: 11.5 g/dL — ABNORMAL LOW (ref 12.0–15.0)
Immature Granulocytes: 0 %
Lymphocytes Relative: 23 %
Lymphs Abs: 2.1 10*3/uL (ref 0.7–4.0)
MCH: 24.7 pg — ABNORMAL LOW (ref 26.0–34.0)
MCHC: 31.7 g/dL (ref 30.0–36.0)
MCV: 78.1 fL — ABNORMAL LOW (ref 80.0–100.0)
Monocytes Absolute: 0.6 10*3/uL (ref 0.1–1.0)
Monocytes Relative: 6 %
Neutro Abs: 6 10*3/uL (ref 1.7–7.7)
Neutrophils Relative %: 66 %
Platelets: 237 10*3/uL (ref 150–400)
RBC: 4.65 MIL/uL (ref 3.87–5.11)
RDW: 14.2 % (ref 11.5–15.5)
WBC: 9.1 10*3/uL (ref 4.0–10.5)
nRBC: 0 % (ref 0.0–0.2)

## 2018-12-17 LAB — BASIC METABOLIC PANEL
Anion gap: 7 (ref 5–15)
BUN: 7 mg/dL — ABNORMAL LOW (ref 8–23)
CO2: 27 mmol/L (ref 22–32)
Calcium: 8.9 mg/dL (ref 8.9–10.3)
Chloride: 109 mmol/L (ref 98–111)
Creatinine, Ser: 0.81 mg/dL (ref 0.44–1.00)
GFR calc Af Amer: >60 mL/min (ref >60)
GFR calc non Af Amer: >60 mL/min (ref >60)
Glucose, Bld: 85 mg/dL (ref 70–99)
Potassium: 3.4 mmol/L — ABNORMAL LOW (ref 3.5–5.1)
Sodium: 143 mmol/L (ref 135–145)

## 2018-12-17 LAB — BRAIN NATRIURETIC PEPTIDE: B Natriuretic Peptide: 56.1 pg/mL (ref 0.0–100.0)

## 2018-12-17 MED ORDER — ALBUTEROL SULFATE HFA 108 (90 BASE) MCG/ACT IN AERS
8.0000 | INHALATION_SPRAY | Freq: Once | RESPIRATORY_TRACT | Status: AC
  Administered 2018-12-17: 8 via RESPIRATORY_TRACT

## 2018-12-17 MED ORDER — ALBUTEROL SULFATE HFA 108 (90 BASE) MCG/ACT IN AERS
INHALATION_SPRAY | RESPIRATORY_TRACT | Status: AC
  Administered 2018-12-17: 04:00:00
  Filled 2018-12-17: qty 6.7

## 2018-12-17 MED ORDER — ACETAMINOPHEN 500 MG PO TABS
1000.0000 mg | ORAL_TABLET | Freq: Once | ORAL | Status: AC
  Administered 2018-12-17: 04:00:00 1000 mg via ORAL
  Filled 2018-12-17: qty 2
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
## 2018-12-17 NOTE — ED Triage Notes (Signed)
Patient with history of asthma.  She has been using her inhaler with no relief.  Patient with one to two word sentences, than she has to take a breath.  She states that this has been going on since Saturday evening.

## 2018-12-17 NOTE — Discharge Instructions (Signed)
You may use your albuterol inhaler 2 to 4 puffs every 2-4 hours as needed for shortness of breath and wheezing.

## 2019-01-20 ENCOUNTER — Ambulatory Visit: Payer: Medicare Other | Admitting: Orthopedic Surgery

## 2020-04-29 DIAGNOSIS — M542 Cervicalgia: Secondary | ICD-10-CM | POA: Insufficient documentation

## 2023-06-11 ENCOUNTER — Encounter (HOSPITAL_COMMUNITY): Payer: Self-pay

## 2023-06-11 ENCOUNTER — Observation Stay (HOSPITAL_COMMUNITY)
Admission: EM | Admit: 2023-06-11 | Discharge: 2023-06-12 | Disposition: A | Discharge status: Home / Self Care | Payer: Medicare Other | Attending: Family Medicine | Admitting: Family Medicine

## 2023-06-11 ENCOUNTER — Other Ambulatory Visit: Payer: Self-pay

## 2023-06-11 ENCOUNTER — Emergency Department (HOSPITAL_COMMUNITY): Payer: Medicare Other

## 2023-06-11 DIAGNOSIS — J45998 Other asthma: Secondary | ICD-10-CM | POA: Insufficient documentation

## 2023-06-11 DIAGNOSIS — R791 Abnormal coagulation profile: Secondary | ICD-10-CM | POA: Diagnosis not present

## 2023-06-11 DIAGNOSIS — Z79899 Other long term (current) drug therapy: Secondary | ICD-10-CM | POA: Insufficient documentation

## 2023-06-11 DIAGNOSIS — R7989 Other specified abnormal findings of blood chemistry: Secondary | ICD-10-CM | POA: Insufficient documentation

## 2023-06-11 DIAGNOSIS — I509 Heart failure, unspecified: Principal | ICD-10-CM

## 2023-06-11 DIAGNOSIS — I10 Essential (primary) hypertension: Secondary | ICD-10-CM | POA: Diagnosis present

## 2023-06-11 DIAGNOSIS — R0602 Shortness of breath: Secondary | ICD-10-CM | POA: Diagnosis present

## 2023-06-11 DIAGNOSIS — I5020 Unspecified systolic (congestive) heart failure: Principal | ICD-10-CM | POA: Insufficient documentation

## 2023-06-11 DIAGNOSIS — I110 Hypertensive heart disease with heart failure: Secondary | ICD-10-CM | POA: Diagnosis not present

## 2023-06-11 HISTORY — DX: Heart failure, unspecified: I50.9

## 2023-06-11 LAB — CBC
HCT: 39.4 % (ref 36.0–46.0)
Hemoglobin: 12.1 g/dL (ref 12.0–15.0)
MCH: 24.4 pg — ABNORMAL LOW (ref 26.0–34.0)
MCHC: 30.7 g/dL (ref 30.0–36.0)
MCV: 79.6 fL — ABNORMAL LOW (ref 80.0–100.0)
Platelets: 237 10*3/uL (ref 150–400)
RBC: 4.95 MIL/uL (ref 3.87–5.11)
RDW: 13.6 % (ref 11.5–15.5)
WBC: 9.3 10*3/uL (ref 4.0–10.5)
nRBC: 0 % (ref 0.0–0.2)

## 2023-06-11 LAB — BRAIN NATRIURETIC PEPTIDE: B Natriuretic Peptide: 200.3 pg/mL — ABNORMAL HIGH (ref 0.0–100.0)

## 2023-06-11 LAB — BASIC METABOLIC PANEL
Anion gap: 6 (ref 5–15)
BUN: 5 mg/dL — ABNORMAL LOW (ref 8–23)
CO2: 29 mmol/L (ref 22–32)
Calcium: 9 mg/dL (ref 8.9–10.3)
Chloride: 105 mmol/L (ref 98–111)
Creatinine, Ser: 0.79 mg/dL (ref 0.44–1.00)
GFR, Estimated: >60 mL/min (ref >60)
Glucose, Bld: 95 mg/dL (ref 70–99)
Potassium: 3.5 mmol/L (ref 3.5–5.1)
Sodium: 140 mmol/L (ref 135–145)

## 2023-06-11 LAB — HEPATIC FUNCTION PANEL
ALT: 12 U/L (ref 0–44)
AST: 18 U/L (ref 15–41)
Albumin: 3.4 g/dL — ABNORMAL LOW (ref 3.5–5.0)
Alkaline Phosphatase: 110 U/L (ref 38–126)
Bilirubin, Direct: 0.2 mg/dL (ref 0.0–0.2)
Indirect Bilirubin: 0.6 mg/dL (ref 0.3–0.9)
Total Bilirubin: 0.8 mg/dL (ref <1.2)
Total Protein: 6.6 g/dL (ref 6.5–8.1)

## 2023-06-11 LAB — TROPONIN I (HIGH SENSITIVITY)
Troponin I (High Sensitivity): 9 ng/L (ref <18)
Troponin I (High Sensitivity): 8 ng/L (ref <18)

## 2023-06-11 LAB — MAGNESIUM: Magnesium: 2 mg/dL (ref 1.7–2.4)

## 2023-06-11 LAB — D-DIMER, QUANTITATIVE: D-Dimer, Quant: 1.26 ug{FEU}/mL — ABNORMAL HIGH (ref 0.00–0.50)

## 2023-06-11 MED ORDER — IOHEXOL 350 MG/ML SOLN
75.0000 mL | Freq: Once | INTRAVENOUS | Status: AC | PRN
  Administered 2023-06-11: 75 mL via INTRAVENOUS

## 2023-06-11 MED ORDER — FUROSEMIDE 10 MG/ML IJ SOLN
INTRAMUSCULAR | Status: AC
  Filled 2023-06-11: qty 4

## 2023-06-11 MED ORDER — DIPHENHYDRAMINE HCL 25 MG PO CAPS
25.0000 mg | ORAL_CAPSULE | Freq: Four times a day (QID) | ORAL | Status: DC | PRN
  Administered 2023-06-11 – 2023-06-12 (×2): 25 mg via ORAL
  Filled 2023-06-11 (×2): qty 1

## 2023-06-11 MED ORDER — ALBUTEROL SULFATE (2.5 MG/3ML) 0.083% IN NEBU: 2.5000 mg | INHALATION_SOLUTION | Freq: Four times a day (QID) | RESPIRATORY_TRACT | Status: DC | PRN

## 2023-06-11 MED ORDER — ENOXAPARIN SODIUM 60 MG/0.6ML IJ SOSY: 50.0000 mg | PREFILLED_SYRINGE | INTRAMUSCULAR | Status: DC

## 2023-06-11 MED ORDER — POTASSIUM CHLORIDE CRYS ER 20 MEQ PO TBCR
40.0000 meq | EXTENDED_RELEASE_TABLET | Freq: Once | ORAL | Status: AC
  Administered 2023-06-11: 40 meq via ORAL
  Filled 2023-06-11: qty 2

## 2023-06-11 MED ORDER — ORAL CARE MOUTH RINSE: 15.0000 mL | OROMUCOSAL | Status: DC | PRN

## 2023-06-11 MED ORDER — FUROSEMIDE 10 MG/ML IJ SOLN
20.0000 mg | Freq: Once | INTRAMUSCULAR | Status: AC
  Administered 2023-06-11: 20 mg via INTRAVENOUS
  Filled 2023-06-11: qty 2
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
## 2023-06-11 NOTE — Assessment & Plan Note (Addendum)
Does appear to have volume overload on exam.  While no JVD is visible, she does have bilateral pitting edema of the ankles and her BNP is elevated to 200 with reported 30 lbs weight gain over the past 3 months. S/p lasix 20mg  IV, K 40 mEq -Admit to [facility]MTS, attending Dr. McDiarmid, Med Tele unit -Continuous cardiac monitoring -Strict I's and O's, daily weights -Heart healthy diet -Redose lasix in AM based on I&Os -f/u transthoracic echocardiogram, may consult cardiology based on findings -AM BMP, Mag; replenish with goal K>4, Mag>2

## 2023-06-11 NOTE — ED Provider Notes (Signed)
[place] EMERGENCY DEPARTMENT AT [facility] Provider Note   CSN: [id number] Arrival date & time: 06/11/23  1042     History  Chief Complaint  Patient presents with   Chest Pain   Shortness of Breath    Stacy Ashley is a 77 y.o. female.   Chest Pain Associated symptoms: shortness of breath   Shortness of Breath Patient presents for shortness of breath.  Medical history includes obesity, asthma, CAD, anemia.  She has been driving a lot lately.  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  She is on a blood thinner.  Last night, she was in her normal state of health.  She woke up at 1 AM feeling short of breath.  At the time, she had a pain in her right arm.  This resolved after approximately 2 hours.  As she was getting ready this morning, she had return of shortness of breath.  This has persisted.  She has not had any pain this morning.  She is not on a diuretic.  She was diagnosed with asthma in 2007.  She has not had any exacerbations in years.  She has been under increased stress lately.     Home Medications Prior to Admission medications   Medication Sig Start Date End Date Taking? Authorizing Provider  albuterol (VENTOLIN HFA) 108 (90 Base) MCG/ACT inhaler Inhale 1-2 puffs into the lungs every 4 (four) hours as needed for wheezing.     [provider]  cetirizine (ZYRTEC) 10 MG tablet Take 10 mg by mouth daily.  09/12/11  [provider]      Allergies    Bee venom, Amoxicillin-pot clavulanate, and Morphine    Review of Systems   Review of Systems  Respiratory:  Positive for shortness of breath.   All other systems reviewed and are negative.   Physical Exam Updated Vital Signs BP (!) 166/83   Pulse 66   Temp 97.6 F (36.4 C) (Oral)   Resp 17   Ht 5\' 6"  (1.676 m)   Wt 100.2 kg   SpO2 100%   BMI 35.65 kg/m  Physical  Exam Vitals and nursing note reviewed.  Constitutional:      General: She is not in acute distress.    Appearance: She is well-developed. She is not ill-appearing, toxic-appearing or diaphoretic.  HENT:     Head: Normocephalic and atraumatic.  Eyes:     Conjunctiva/sclera: Conjunctivae normal.  Cardiovascular:     Rate and Rhythm: Normal rate and regular rhythm.     Heart sounds: No murmur heard. Pulmonary:     Effort: Pulmonary effort is normal. Tachypnea present. No respiratory distress.     Breath sounds: Normal breath sounds. No stridor, decreased air movement or transmitted upper airway sounds. No wheezing, rhonchi or rales.  Abdominal:     Palpations: Abdomen is soft.     Tenderness: There is no abdominal tenderness.  Musculoskeletal:        General: No swelling.     Cervical back: Normal range of motion and neck supple.  Skin:    General: Skin is warm and dry.     Capillary Refill: Capillary refill takes less than 2 seconds.  Neurological:     Mental Status: She is alert.  Psychiatric:        Mood and Affect: Mood normal.  ED Results / Procedures / Treatments   Labs (all labs ordered are listed, but only abnormal results are displayed) Labs Reviewed  BASIC METABOLIC PANEL - Abnormal; Notable for the following components:      Result Value   BUN 5 (*)    All other components within normal limits  CBC - Abnormal; Notable for the following components:   MCV 79.6 (*)    MCH 24.4 (*)    All other components within normal limits  BRAIN NATRIURETIC PEPTIDE - Abnormal; Notable for the following components:   B Natriuretic Peptide 200.3 (*)    All other components within normal limits  HEPATIC FUNCTION PANEL - Abnormal; Notable for the following components:   Albumin 3.4 (*)    All other components within normal limits  D-DIMER, QUANTITATIVE - Abnormal; Notable for the following components:   D-Dimer, Quant 1.26 (*)    All other components within normal limits   MAGNESIUM  TROPONIN I (HIGH SENSITIVITY)  TROPONIN I (HIGH SENSITIVITY)    EKG None  Radiology CT Angio Chest PE W and/or Wo Contrast Result Date: 06/11/2023 CLINICAL DATA:  Pulmonary embolism (PE) suspected, high prob. Chest pain. Shortness of breath. EXAM: CT ANGIOGRAPHY CHEST WITH CONTRAST TECHNIQUE: Multidetector CT imaging of the chest was performed using the standard protocol during bolus administration of intravenous contrast. Multiplanar CT image reconstructions and MIPs were obtained to evaluate the vascular anatomy. RADIATION DOSE REDUCTION: This exam was performed according to the departmental dose-optimization program which includes automated exposure control, adjustment of the mA and/or kV according to patient size and/or use of iterative reconstruction technique. CONTRAST:  75mL OMNIPAQUE IOHEXOL 350 MG/ML SOLN COMPARISON:  CT angiography chest from 04/03/2012. FINDINGS: Cardiovascular: No evidence of embolism to the proximal subsegmental pulmonary artery level. Mild cardiomegaly. No pericardial effusion. No aortic aneurysm. Mediastinum/Nodes: Visualized thyroid gland appears grossly unremarkable. No solid / cystic mediastinal masses. The esophagus is nondistended precluding optimal assessment. There are mildly enlarged mediastinal, bilateral axillary and bilateral hilar lymph nodes, which are nonspecific but appear slightly less pronounced than the prior exam. Lungs/Pleura: The central tracheo-bronchial tree is patent. There is smooth interlobular septal thickening along with thickened walls of the bilateral bronchi favoring congestive heart failure/pulmonary edema. There are patchy areas of linear, plate-like atelectasis and/or scarring throughout bilateral lungs. No mass or consolidation. No pleural effusion or pneumothorax. No suspicious lung nodules. Upper Abdomen: Surgically absent gallbladder. There is reflux of contrast in the central intrahepatic veins, suggesting right heart  strain. Remaining visualized upper abdominal viscera within normal limits. Musculoskeletal: The visualized soft tissues of the chest wall are grossly unremarkable. No suspicious osseous lesions. There are mild multilevel degenerative changes in the visualized spine. Review of the MIP images confirms the above findings. IMPRESSION: *No embolism to the proximal subsegmental pulmonary artery level. *Findings described above favoring congestive heart failure/pulmonary edema. *Multiple other nonacute observations, as described above. Electronically Signed   By: Jules Schick M.D.   On: 06/11/2023 16:14   DG Chest 2 View Result Date: 06/11/2023 CLINICAL DATA:  Chest pain. EXAM: CHEST - 2 VIEW COMPARISON:  12/17/2018. FINDINGS: Bilateral lung fields are clear. Bilateral costophrenic angles are clear. Stable cardio-mediastinal silhouette. No acute osseous abnormalities. The soft tissues are within normal limits. IMPRESSION: *No active cardiopulmonary disease. Electronically Signed   By: Jules Schick M.D.   On: 06/11/2023 11:33    Procedures Procedures    Medications Ordered in ED Medications  furosemide (LASIX) 10 MG/ML injection (  Canceled Entry  06/11/23 1456)  potassium chloride SA (KLOR-CON M) CR tablet 40 mEq (40 mEq Oral Given 06/11/23 1453)  furosemide (LASIX) injection 20 mg (20 mg Intravenous Given 06/11/23 1452)  iohexol (OMNIPAQUE) 350 MG/ML injection 75 mL (75 mLs Intravenous Contrast Given 06/11/23 1529)    ED Course/ Medical Decision Making/ A&P                                 Medical Decision Making Amount and/or Complexity of Data Reviewed Labs: ordered. Radiology: ordered.  Risk Prescription drug management.   This patient presents to the ED for concern of shortness of breath, this involves an extensive number of treatment options, and is a complaint that carries with it a high risk of complications and morbidity.  The differential diagnosis includes CHF, pneumonia, PE,  reactive airway disease exacerbation, anemia, acidosis   Co morbidities that complicate the patient evaluation  obesity, asthma, CAD, anemia   Additional history obtained:  Additional history obtained from N/A External records from outside source obtained and reviewed including EMR   Lab Tests:  I Ordered, and personally interpreted labs.  The pertinent results include: Normal hemoglobin, no leukocytosis, mildly elevated D-dimer, moderately elevated BNP suggestive of possibly undiagnosed CHF.   Imaging Studies ordered:  I ordered imaging studies including chest x-ray, CTA chest I independently visualized and interpreted imaging which showed no evidence of PE or pneumonia.  Findings consistent with CHF. I agree with the radiologist interpretation   Cardiac Monitoring: / EKG:  The patient was maintained on a cardiac monitor.  I personally viewed and interpreted the cardiac monitored which showed an underlying rhythm of: Sinus rhythm  Problem List / ED Course / Critical interventions / Medication management  Patient presents for shortness of breath.  She had onset of this last night.  A return to this morning.  On arrival in the ED, she is tachypneic with mildly increased work of breathing.  She is able to speak in complete sentences.  Lungs are clear to auscultation.  She has done a lot of driving lately.  She does state that her feet and ankles swell.  She has not noticed any recent unilateral swelling.  Workup was initiated.  Lab work is notable for mildly elevated D-dimer and moderately elevated BNP.  I suspect possible undiagnosed CHF.  Potassium chloride was ordered to optimize electrolytes.  Lasix was ordered for diuresis.  Patient underwent CTA of chest which did not show evidence of PE or pneumonia.  Findings are consistent with CHF.  Patient was admitted for further treatment and workup. I ordered medication including potassium chloride for electrolyte optimization; Lasix for  diuresis Reevaluation of the patient after these medicines showed that the patient improved I have reviewed the patients home medicines and have made adjustments as needed   Social Determinants of Health:  [place].        Final Clinical Impression(s) / ED Diagnoses Final diagnoses:  Acute congestive heart failure, unspecified heart failure type [facility])    Rx / DC Orders ED Discharge Orders     None         Gloris Manchester, MD 06/11/23 1622

## 2023-06-11 NOTE — Assessment & Plan Note (Addendum)
Persistently hypertensive on admission, suspect undiagnosed hypertension. Bps range 150s-170s/70s-90s. -Monitor overnight following diuresis, consider starting amlodipine or ARB in the AM

## 2023-06-11 NOTE — Plan of Care (Signed)
FMTS Interim Progress Note  S:Dr. Sherrilee Gilles and Dr. Velna Ochs to bedside. Patient ambulating to bed on arrival. No shortness of breath at this time. Discussed possible diagnosing of heart failure with patient. Patient with no concerns at this time. Reports she has been urinating more since being given lasix.  O: BP 120/74 (BP Location: Left Arm)   Pulse 76   Temp 98.2 F (36.8 C) (Oral)   Resp 18   Ht 5\' 6"  (1.676 m)   Wt 110.1 kg   SpO2 98%   BMI 39.17 kg/m   General: NAD  Neuro: A&O Cardiovascular: RRR, no murmurs, trace peripheral edema Respiratory: normal WOB on RA, CTAB, no crackles Extremities: Moving all 4 extremities equally, warm well perfused   A/P: Congestive Heart Failure Appear mild volume overloaded on exam. Respiratory status stable. Continue plan per today's H&P. -F/u ECHO -AM BMP, Windell Moulding, MD 06/11/2023, 8:10 PM PGY-2, [facility] Medicine Service pager [phone]

## 2023-06-11 NOTE — Assessment & Plan Note (Signed)
Likely elevated 2/2 CHF exacerbation.  CTA chest for PE negative.  Bilateral pitting edema equal and Wells score for DVT of 0.  Will note that elevated D-dimer in setting of CHF exacerbation can be poor prognostic factor based on brief literature review.

## 2023-06-11 NOTE — H&P (Cosign Needed Addendum)
Hospital Admission History and Physical Service Pager: [phone]  Patient name: Stacy Ashley Medical record number: [id number] Date of Birth: 05-Nov-1945 Age: 77 y.o. Gender: female  Primary Care Provider: Pcp, No Consultants: None Code Status: Full code, which was confirmed with patient at bedside Preferred Emergency Contact: [facility] Information     Name Relation Home Work Mobile   Pinnix,Ronnie Sister [phone]  [phone]   John Giovanni [phone]        Other Contacts   None on File    Chief Complaint: Acute onset shortness of breath  Assessment and Plan: Stacy Ashley is a 77 y.o. female presenting with acute onset dyspnea.  Differential for this patient's presentation includes CHF exacerbation, valvular disease (i.e. AS), pulmonary embolism, asthma exacerbation.  Favor CHF exacerbation given elevated BNP, weight gain of 30 pounds over the course of 3 months, and significant lower extremity edema even in the absence of JVD.  However, this would be a new diagnosis of CHF and will obtain echocardiogram to further investigate heart function.  Etiology of CHF in this patient is unclear, though she does have some precordial lead Q waves possibly suggestive of past infarct.  However, patient is unaware of past infarction save for brief mention by PCP of some sort of cardiac event her past medical history not visible to [place] here.  Echocardiogram will simultaneously serve to evaluate for valvular disease, though no murmur auscultated on exam.  Pulmonary embolism ruled out by CTA chest in spite of elevated D-dimer.  Given that no wheezing on exam, do not favor asthma exacerbation at this time.  Will continue diuresis and redose Lasix tomorrow morning based on output overnight. Assessment & Plan CHF (congestive heart failure) (HCC) Does appear to have volume overload on exam.  While no JVD is visible, she does have bilateral pitting edema of the ankles and  her BNP is elevated to 200 with reported 30 lbs weight gain over the past 3 months. S/p lasix 20mg  IV, K 40 mEq -Admit to FMTS, attending Dr. McDiarmid, Med Tele unit -Continuous cardiac monitoring -Strict I's and O's, daily weights -Heart healthy diet -Redose lasix in AM based on I&Os -f/u transthoracic echocardiogram, may consult cardiology based on findings -AM BMP, Mag; replenish with goal K>4, Mag>2 HTN (hypertension) Persistently hypertensive on admission, suspect undiagnosed hypertension. Bps range 150s-170s/70s-90s. -Monitor overnight following diuresis, consider starting amlodipine or ARB in the AM Positive D dimer Likely elevated 2/2 CHF exacerbation.  CTA chest for PE negative.  Bilateral pitting edema equal and Wells score for DVT of 0.  Will note that elevated D-dimer in setting of CHF exacerbation can be poor prognostic factor based on brief literature review.  Chronic and stable: Asthma: Albuterol neb Q6h PRN, will need refill of inhaler at discharge  FEN/GI: Heart healthy diet VTE Prophylaxis: Enoxaparin  Disposition: Med Tele  History of Present Illness: Stacy Ashley is a 77 y.o. female with a pertinent PMH of asthma presenting with acute onset dyspnea overnight.  Patient states that she woke up overnight and thought she was having an asthma attack.  She was very short of breath, particularly ambulating to the restroom.  Denies chest pressure/pain, though possible she experienced some chest tightness that has since resolved.  She was nervous about lying back down to go to sleep and then had difficulty breathing this morning.  This went on for a couple hours but then went away suddenly.  She noticed a sensation again in  the morning when bending down to tie her shoes and decided to present to the ED.  In the ED, patient was tachypneic and dyspneic on arrival.  BNP elevated to 200, 20 mg Lasix provided.  Given that K was 3.5 in setting of diuresis, repleted with 40 mEq  oral.  D-dimer elevated to 1.26 and CTA chest for PE obtained showing no pulmonary embolism and suggestive of congestive heart failure/pulmonary edema.  Troponin and CBC WNL.  FMTS consulted for admission for suspected CHF exacerbation.  On FMTS evaluation, patient endorses feeling better with some mild residual dyspnea.  While her legs do swell at baseline, there has been increased swelling in her legs, worsening over the past 2-3 weeks.  She states that "my ankles were the same size as my calves" yesterday to the point that she had a hard time putting her shoes on.  This improved with elevating her legs overnight.  She does not ever wake up at night with difficulty breathing and sleeps with 1 pillow.  Does endorse a weight gain of 30 pounds since September.  She notes that she has had 2 severe asthma attacks since being diagnosed in 2007.  Review Of Systems: Per HPI.  Pertinent Past Medical History: -Asthma  Remainder reviewed in history tab.   Pertinent Past Surgical History: -Appendectomy -C/S -Tubal ligation -Nasal sinus surgery, septoplasty  Remainder reviewed in history tab.  Pertinent Social History: Tobacco use: None Alcohol use: None Other Substance use: None Lives with sisters here in [place] at the moment, also has home in [place] with husband. Takes care of her little sister who has MS.  Pertinent Family History: -Mother: HTN, diabetes -MGM: Diabetes -MGF: Colon cancer  Remainder reviewed in history tab.   Important Outpatient Medications: -Albuterol PRN (does not have any now, uses 1-2 times per year)  Remainder reviewed in medication history.   Objective: BP (!) 166/83   Pulse 66   Temp 97.6 F (36.4 C) (Oral)   Resp 17   Ht 5\' 6"  (1.676 m)   Wt 100.2 kg   SpO2 100%   BMI 35.65 kg/m   Physical Exam: General: Adult female, appears younger than stated age, resting in bed, NAD.  Alert and oriented x 4. Eyes: No scleral icterus, no injections. ENTM:  MMM. Neck: No JVD. Cardiovascular: RRR.  Normal S1/2.  No murmur/rub/gallops. Respiratory: CTAB.  No work of breathing on room air. Gastrointestinal: Normoactive bowel sounds.  No TTP in all quadrants, no rebound or guarding. MSK: Moving all extremities appropriately. Extremities: 2+ bilateral pitting edema lower extremities.  Capillary refill 2-3 seconds.  No calf tenderness bilaterally. Derm: No rashes grossly. Neuro: No focal neurological deficit, converses appropriately and follows instructions. Psych: Polite, conversational.  Attention and concentration normal.  Labs:  CBC BMET  Recent Labs  Lab 06/11/23 1230  WBC 9.3  HGB 12.1  HCT 39.4  PLT 237   Recent Labs  Lab 06/11/23 1230  NA 140  K 3.5  CL 105  CO2 29  BUN 5*  CREATININE 0.79  GLUCOSE 95  CALCIUM 9.0     Pertinent additional labs: -Troponin: 9>8 -Magnesium: 2.0 -D-dimer: 1.26 -BNP: 200.3  Own interpretation of EKG: NSR.  Q waves in V1-V3 with loss of R wave progression, possibly consistent with old prior infarct but indeterminate.  QTcB 422.  Imaging Studies: -CXR: Possible pulmonary edema.  Sharp costophrenic angles bilaterally without pleural effusion.  Largely unchanged from prior CXR June 2020. -CTA chest: No PE.  Favor congestive heart failure/pulmonary edema.  Independently reviewed and agree with radiologist's interpretation.  Dimitry Sharion Dove, MD 06/11/2023, 4:29 PM  PGY-1, [facility] Family Medicine FPTS Intern pager: [phone], text pages welcome Secure chat group [facility] Teaching Service  I was personally present and performed or re-performed the history, physical exam and medical decision making activities of this service and have verified that the service and findings are accurately documented in the resident's note.  Shelby Mattocks, DO                  06/11/2023, 6:25 PM

## 2023-06-11 NOTE — ED Triage Notes (Addendum)
Pt c/o non radiating midsternal chest pain and SOB upon waking at 0100 today. Pt c/o SOB w/exertion. Pt has labored breathing. Pt unable to speak in complete sentences

## 2023-06-12 ENCOUNTER — Inpatient Hospital Stay (HOSPITAL_BASED_OUTPATIENT_CLINIC_OR_DEPARTMENT_OTHER): Payer: Medicare Other

## 2023-06-12 ENCOUNTER — Other Ambulatory Visit (HOSPITAL_COMMUNITY): Payer: Self-pay

## 2023-06-12 DIAGNOSIS — I509 Heart failure, unspecified: Secondary | ICD-10-CM

## 2023-06-12 DIAGNOSIS — I10 Essential (primary) hypertension: Secondary | ICD-10-CM

## 2023-06-12 DIAGNOSIS — I5020 Unspecified systolic (congestive) heart failure: Secondary | ICD-10-CM | POA: Diagnosis not present

## 2023-06-12 LAB — ECHOCARDIOGRAM COMPLETE
AR max vel: 2.74 cm2
AV Area VTI: 2.9 cm2
AV Area mean vel: 2.65 cm2
AV Mean grad: 5 mm[Hg]
AV Peak grad: 6.4 mm[Hg]
Ao pk vel: 1.26 m/s
Area-P 1/2: 4.06 cm2
Height: 66 in
S' Lateral: 3.7 cm
Weight: 3859.2 [oz_av]

## 2023-06-12 LAB — BASIC METABOLIC PANEL
Anion gap: 9 (ref 5–15)
BUN: 7 mg/dL — ABNORMAL LOW (ref 8–23)
CO2: 26 mmol/L (ref 22–32)
Calcium: 8.8 mg/dL — ABNORMAL LOW (ref 8.9–10.3)
Chloride: 105 mmol/L (ref 98–111)
Creatinine, Ser: 0.9 mg/dL (ref 0.44–1.00)
GFR, Estimated: >60 mL/min (ref >60)
Glucose, Bld: 96 mg/dL (ref 70–99)
Potassium: 4 mmol/L (ref 3.5–5.1)
Sodium: 140 mmol/L (ref 135–145)

## 2023-06-12 LAB — MAGNESIUM: Magnesium: 2 mg/dL (ref 1.7–2.4)

## 2023-06-12 MED ORDER — FUROSEMIDE 20 MG PO TABS
20.0000 mg | ORAL_TABLET | Freq: Every day | ORAL | 0 refills | Status: AC | PRN
  Filled 2023-06-12: qty 30, 30d supply, fill #0

## 2023-06-12 MED ORDER — POTASSIUM CHLORIDE CRYS ER 20 MEQ PO TBCR
20.0000 meq | EXTENDED_RELEASE_TABLET | Freq: Every day | ORAL | 0 refills | Status: AC | PRN
  Filled 2023-06-12: qty 30, 30d supply, fill #0

## 2023-06-12 MED ORDER — ALBUTEROL SULFATE HFA 108 (90 BASE) MCG/ACT IN AERS
1.0000 | INHALATION_SPRAY | RESPIRATORY_TRACT | 1 refills | Status: AC | PRN
  Filled 2023-06-12: qty 6.7, 25d supply, fill #0

## 2023-06-12 NOTE — Care Management Obs Status (Signed)
MEDICARE OBSERVATION STATUS NOTIFICATION   Patient Details  Name: Stacy Ashley MRN: [id number] Date of Birth: [birth year]   Medicare Observation Status Notification Given:       Gordy Clement, RN 06/12/2023, 11:56 AM

## 2023-06-12 NOTE — TOC Progression Note (Signed)
Transition of Care [facility]) - Progression Note    Patient Details  Name: Stacy Ashley MRN: [id number] Date of Birth: [birth year]  Transition of Care [facility]) CM/SW Contact  Gordy Clement, RN Phone Number: 06/12/2023, 12:16 PM  Clinical Narrative:     Patient will DC to home today Code 44 has been delivered and discussed. Family to transport No additional TOC needs          Expected Discharge Plan and Services         Expected Discharge Date: 06/12/23                                     Social Determinants of Health (SDOH) Interventions SDOH Screenings   Food Insecurity: No Food Insecurity (06/11/2023)  Housing: High Risk (06/11/2023)  Transportation Needs: No Transportation Needs (06/11/2023)  Utilities: Not At Risk (06/11/2023)  Social Connections: Unknown (11/01/2021)   Received from [facility]  Tobacco Use: Low Risk  (06/11/2023)    Readmission Risk Interventions     No data to display

## 2023-06-12 NOTE — Discharge Instructions (Addendum)
Dear Stacy Ashley,   Thank you so much for allowing [place] to be part of your care!  You were admitted to [facility] for shortness of breath and were found to have evidence of heart failure on your labs and your CT scan. You were given a dose of IV lasix which helped remove some excess fluid. We recommend your primary care doctor to refer you to a cardiologist to follow up the echocardiogram you had as well as optimize your heart failure and hypertension (high blood pressure) treatment. We have prescribed some as needed lasix for you for when you go home. You can take this with the potassium tablet we have prescribed you if you start to feel like you have more swelling, have increased your weight by 5 pounds, or are starting to feel short of breath and have not had relief with your albuterol inhaler.    POST-HOSPITAL & CARE INSTRUCTIONS Take lasix as needed with the instructions above  We have made you a follow up appointment at our clinic.  Please let PCP/Specialists know of any changes that were made.  Please see medications section of this packet for any medication changes.   DOCTOR'S APPOINTMENT & FOLLOW UP CARE INSTRUCTIONS  No future appointments.  RETURN PRECAUTIONS: If you start to feel light headed, short of breath, or have chest pain please go to the emergency department.   Take care and be well!  Family Medicine Teaching Service  [place]  [facility]  [address] [place], [place] [address] [phone]

## 2023-06-12 NOTE — Hospital Course (Addendum)
Stacy Ashley is a 77 y.o.female with a history of  asthma who was admitted to the [facility] Teaching Service at [facility] for new onset heart failure. Her hospital course is detailed below:  New acute congestive heart failure Present with acute onset dyspnea. BNP was elevated with acute increase to weight. Remained stable on room air throughout admission. CTA ruled out pulmonary embolism. Patient was administered 20 mg IV Lasix with symptomatic improvement. Echo was pending at discharge. Patient appeared euvolemic at discharge and did not endorse any shortness of breath. Patient will need outpatient follow up.   HTN Patient with persistent hypertension while admitted. Patient does have multiple stressors at the moment, which could be contributing. No medication was started prior to discharge.   Other chronic conditions were medically managed with home medications and formulary alternatives as necessary (Asthma)  PCP Follow-up Recommendations: Follow up echo results Consider adding GDMT Consider HTN medication Consider cardiology referral pending Echo results  Consider management of asthma outpatient

## 2023-06-12 NOTE — Progress Notes (Signed)
Mobility Specialist Progress Note:    06/12/23 1117  Mobility  Activity Ambulated with assistance in hallway  Level of Assistance Standby assist, set-up cues, supervision of patient - no hands on  Assistive Device None  Distance Ambulated (ft) 500 ft  Activity Response Tolerated well  Mobility Referral Yes  Mobility visit 1 Mobility  Mobility Specialist Start Time (ACUTE ONLY) 0905  Mobility Specialist Stop Time (ACUTE ONLY) 0915  Mobility Specialist Time Calculation (min) (ACUTE ONLY) 10 min   Received pt in bed having no complaints and agreeable to mobility. Pt was asymptomatic throughout ambulation and returned to room w/o fault. Left in bed w/ call bell in reach and all needs met.   Thompson Grayer Mobility Specialist  Please contact vis Secure Chat or  Rehab Office [phone]

## 2023-06-12 NOTE — Care Management CC44 (Signed)
Condition Code 44 Documentation Completed  Patient Details  Name: KHALYN LAB MRN: [id number] Date of Birth: 1946-06-08   Condition Code 44 given:  Yes Patient signature on Condition Code 44 notice:  Yes Documentation of 2 MD's agreement:  Yes Code 44 added to claim:  Yes    Gordy Clement, RN 06/12/2023, 11:56 AM

## 2023-06-12 NOTE — Assessment & Plan Note (Deleted)
Likely elevated 2/2 CHF exacerbation.  CTA chest for PE negative.  Bilateral pitting edema equal and Wells score for DVT of 0.  Will note that elevated D-dimer in setting of CHF exacerbation can be poor prognostic factor based on brief literature review.

## 2023-06-12 NOTE — Progress Notes (Signed)
Discharge instructions given. Patient verbalized understanding and all questions were answered.  ?

## 2023-06-12 NOTE — Assessment & Plan Note (Deleted)
Does appear to have volume overload on exam.  While no JVD is visible, she does have bilateral pitting edema of the ankles and her BNP is elevated to 200 with reported 30 lbs weight gain over the past 3 months. S/p lasix 20mg  IV, K 40 mEq -Admit to FMTS, attending Dr. McDiarmid, Med Tele unit -Continuous cardiac monitoring -Strict I's and O's, daily weights -Heart healthy diet -Redose lasix in AM based on I&Os -f/u transthoracic echocardiogram, may consult cardiology based on findings -AM BMP, Mag; replenish with goal K>4, Mag>2

## 2023-06-12 NOTE — Discharge Summary (Signed)
Family Medicine Teaching [facility] Discharge Summary  Patient name: Stacy Ashley Medical record number: [id number] Date of birth: [birth year] Age: 77 y.o. Gender: female Date of Admission: 06/11/2023  Date of Discharge: 06/12/2023 Admitting Physician: Leighton Roach McDiarmid, MD  Primary Care Provider: Penne Lash, MD Consultants: None  Indication for Hospitalization: Dyspnea  Discharge Diagnoses/Problem List:  Principal Problem for Admission: New onset CHF Other Problems addressed during stay:  Principal Problem:   CHF (congestive heart failure) (HCC) Active Problems:   HTN (hypertension)   Positive D dimer    Brief Hospital Course:  Stacy Ashley is a 77 y.o.female with a history of  asthma who was admitted to the [facility] Teaching Service at [facility] for new onset heart failure. Her hospital course is detailed below:  New acute congestive heart failure Present with acute onset dyspnea. BNP was elevated with acute increase to weight. Remained stable on room air throughout admission. CTA ruled out pulmonary embolism. Patient was administered 20 mg IV Lasix with symptomatic improvement. Echo was pending at discharge. Patient appeared euvolemic at discharge and did not endorse any shortness of breath. Patient will need outpatient follow up.   HTN Patient with persistent hypertension while admitted. Patient does have multiple stressors at the moment, which could be contributing. No medication was started prior to discharge.   Other chronic conditions were medically managed with home medications and formulary alternatives as necessary (Asthma)  PCP Follow-up Recommendations: Follow up echo results Consider adding GDMT Consider HTN medication Consider cardiology referral pending Echo results  Consider management of asthma outpatient  Disposition: Home  Discharge Condition: Stable  Discharge Exam:  Vitals:   06/12/23 0434 06/12/23 0740  BP: (!) 157/87 (!) 151/90  Pulse:  66 66  Resp: 19 18  Temp: 98.3 F (36.8 C) 97.8 F (36.6 C)  SpO2: 98% 100%   General: A&O, NAD HEENT: No sign of trauma, EOM grossly intact Cardiac: RRR, no m/r/g Respiratory: CTAB, normal WOB, no w/c/r GI: Soft, NTTP, non-distended  Extremities: NTTP, no peripheral edema. Neuro: Normal gait, moves all four extremities appropriately. Psych: Appropriate mood and affect   Significant Procedures: None  Significant Labs and Imaging:  Recent Labs  Lab 06/11/23 1230  WBC 9.3  HGB 12.1  HCT 39.4  PLT 237   Recent Labs  Lab 06/11/23 1230 06/12/23 0254  NA 140 140  K 3.5 4.0  CL 105 105  CO2 29 26  GLUCOSE 95 96  BUN 5* 7*  CREATININE 0.79 0.90  CALCIUM 9.0 8.8*  MG 2.0 2.0  ALKPHOS 110  --   AST 18  --   ALT 12  --   ALBUMIN 3.4*  --      Pertinent Imaging   CT angio PE: *No embolism to the proximal subsegmental pulmonary artery level. *Findings described above favoring congestive heart failure/pulmonary edema. *Multiple other nonacute observations, as described above.  Results/Tests Pending at Time of Discharge:  Echo  Discharge Medications:  Allergies as of 06/12/2023       Reactions   Bee Venom Shortness Of Breath, Swelling   Amoxicillin-pot Clavulanate Itching   Morphine Hives        Medication List     TAKE these medications    albuterol 108 (90 Base) MCG/ACT inhaler Commonly known as: Ventolin HFA Inhale 1-2 puffs into the lungs every 4 (four) hours as needed for wheezing.   furosemide 20 MG tablet Commonly known as: Lasix Take 1 tablet (20 mg total) by  mouth daily as needed (For shortness of breath, increased swelling, or weight gain 5 lbs or greater).   potassium chloride SA 20 MEQ tablet Commonly known as: KLOR-CON M Take 1 tablet (20 mEq total) by mouth daily as needed (take with lasix when taking lasix).        Discharge Instructions: Please refer to Patient Instructions section of EMR for full details.  Patient was counseled  important signs and symptoms that should prompt return to medical care, changes in medications, dietary instructions, activity restrictions, and follow up appointments.   Follow-Up Appointments:  Follow-up Information     Eldonna Neuenfeldt, MD Follow up.   Specialty: Family Medicine Why: Go to the scheduled follow up appointment on Monday December 30th at 9:30 am Contact information: [address] [place] [address] [phone]                 Penne Lash, MD 06/12/2023, 10:24 AM PGY-1, [facility] Family Medicine

## 2023-06-12 NOTE — Assessment & Plan Note (Deleted)
Persistently hypertensive on admission, suspect undiagnosed hypertension. Bps range 150s-170s/70s-90s. -Monitor overnight following diuresis, consider starting amlodipine or ARB in the AM

## 2023-06-18 ENCOUNTER — Ambulatory Visit (INDEPENDENT_AMBULATORY_CARE_PROVIDER_SITE_OTHER): Payer: Medicare Other | Admitting: Family Medicine

## 2023-06-18 VITALS — BP 139/72 | HR 63 | Wt 242.6 lb

## 2023-06-18 DIAGNOSIS — I10 Essential (primary) hypertension: Secondary | ICD-10-CM | POA: Diagnosis not present

## 2023-06-18 DIAGNOSIS — I5032 Chronic diastolic (congestive) heart failure: Secondary | ICD-10-CM | POA: Diagnosis present

## 2023-06-18 NOTE — Assessment & Plan Note (Signed)
Elevated in office.  As above, with shared decision making, elected to continue lifestyle management for now.  Jardiance or finerenone could be good options in the future.  Follow-up 1 month with PCP.

## 2023-06-18 NOTE — Patient Instructions (Signed)
You are doing a wonderful job! Keep up the great work. Come back in 1 month for follow up or sooner if you need [place]!

## 2023-06-18 NOTE — Progress Notes (Signed)
    SUBJECTIVE:   CHIEF COMPLAINT / HPI:   Hospital follow up for CHF, HTN Admitted for new onset CHF. Echo with LVEF 55-60% and G2DD. BP was elevated as well.  Has been doing well since leaving the hospital.  Has not needed Lasix.  No lower extremity edema.  She does not endorse shortness of breath.  She has been incorporating multiple lifestyle changes including improving her diet.  PERTINENT  PMH / PSH: Hypertension, sciatica, degenerative disc disease, elevated BMI  OBJECTIVE:   BP 139/72   Pulse 63   Wt 242 lb 9.6 oz (110 kg)   SpO2 99%   BMI 39.16 kg/m   General: Alert and oriented, in NAD Skin: Warm, dry, and intact without lesions HEENT: NCAT, EOM grossly normal, midline nasal septum Cardiac: RRR, no m/r/g appreciated, no lower extremity edema Respiratory: CTAB anteriorly, breathing and speaking comfortably on RA Abdominal: Soft, nontender, nondistended, normoactive bowel sounds Extremities: Moves all extremities grossly equally Neurological: No gross focal deficit Psychiatric: Appropriate mood and affect   ASSESSMENT/PLAN:   CHF (congestive heart failure), NYHA class I, chronic, diastolic (HCC) Appears euvolemic today.  Reassured by exam.  Discussed medical management for HFpEF including treating medical comorbidities as well as SGLT2 inhibitors.  After shared decision making, patient elected to hold off on starting Jardiance at this time as she would like to continue her lifestyle changes for now.  She will follow-up in 1 month with PCP to further assess.  Can also consider finerenone in the future, as well.  HTN (hypertension) Elevated in office.  As above, with shared decision making, elected to continue lifestyle management for now.  Jardiance or finerenone could be good options in the future.  Follow-up 1 month with PCP.   Janeal Holmes, MD [facility]

## 2023-06-18 NOTE — Assessment & Plan Note (Signed)
Appears euvolemic today.  Reassured by exam.  Discussed medical management for HFpEF including treating medical comorbidities as well as SGLT2 inhibitors.  After shared decision making, patient elected to hold off on starting Jardiance at this time as she would like to continue her lifestyle changes for now.  She will follow-up in 1 month with PCP to further assess.  Can also consider finerenone in the future, as well.

## 2023-07-19 ENCOUNTER — Ambulatory Visit: Payer: Medicare Other | Admitting: Family Medicine

## 2023-07-24 ENCOUNTER — Ambulatory Visit: Payer: Medicare Other | Admitting: Student

## 2023-07-24 ENCOUNTER — Ambulatory Visit
Admission: EM | Admit: 2023-07-24 | Discharge: 2023-07-24 | Disposition: A | Discharge status: Home / Self Care | Payer: Medicare Other | Attending: Family Medicine | Admitting: Family Medicine

## 2023-07-24 DIAGNOSIS — J101 Influenza due to other identified influenza virus with other respiratory manifestations: Secondary | ICD-10-CM

## 2023-07-24 LAB — POC COVID19/FLU A&B COMBO
Covid Antigen, POC: NEGATIVE
Influenza A Antigen, POC: POSITIVE — AB
Influenza B Antigen, POC: NEGATIVE

## 2023-07-24 MED ORDER — OSELTAMIVIR PHOSPHATE 75 MG PO CAPS: 75.0000 mg | ORAL_CAPSULE | Freq: Two times a day (BID) | ORAL | 0 refills | Status: AC

## 2023-07-24 NOTE — ED Triage Notes (Signed)
Pt reports to she has had chills, body aches, fatigue, cough, and headache x 2 days    Took mucinex
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
## 2023-07-24 NOTE — Progress Notes (Deleted)
    SUBJECTIVE:   CHIEF COMPLAINT / HPI:   HFpEF   PERTINENT  PMH / PSH: HTN, CHF  OBJECTIVE:   There were no vitals taken for this visit. ***  General: NAD, pleasant, able to participate in exam Cardiac: RRR, no murmurs. Respiratory: CTAB, normal effort, No wheezes, rales or rhonchi Abdomen: Bowel sounds present, nontender, nondistended, no hepatosplenomegaly. Extremities: no edema or cyanosis. Skin: warm and dry, no rashes noted Neuro: alert, no obvious focal deficits Psych: Normal affect and mood  ASSESSMENT/PLAN:   No problem-specific Assessment & Plan notes found for this encounter.   Doing well, euvolemic on exam.  Dr. Lauraine Molt, DO [facility]    {    This will disappear when note is signed, click to select method of visit    :1}

## 2023-08-02 ENCOUNTER — Ambulatory Visit: Payer: Medicare Other

## 2023-08-31 ENCOUNTER — Ambulatory Visit: Payer: Medicare Other

## 2023-08-31 NOTE — Progress Notes (Deleted)
 Stacy Ashley
